# Patient Record
Sex: Male | Born: 1965 | Hispanic: No | Marital: Married | Smoking: Never smoker
Health system: Southern US, Community
[De-identification: ages and names within clinical notes are randomized; demographics above are authoritative.]

## PROBLEM LIST (undated history)

## (undated) DIAGNOSIS — Z9289 Personal history of other medical treatment: Secondary | ICD-10-CM

## (undated) DIAGNOSIS — I2510 Atherosclerotic heart disease of native coronary artery without angina pectoris: Secondary | ICD-10-CM

## (undated) DIAGNOSIS — I219 Acute myocardial infarction, unspecified: Secondary | ICD-10-CM

## (undated) DIAGNOSIS — I10 Essential (primary) hypertension: Secondary | ICD-10-CM

## (undated) DIAGNOSIS — E785 Hyperlipidemia, unspecified: Secondary | ICD-10-CM

## (undated) HISTORY — PX: BACK SURGERY: SHX140

## (undated) HISTORY — DX: Atherosclerotic heart disease of native coronary artery without angina pectoris: I25.10

## (undated) HISTORY — DX: Personal history of other medical treatment: Z92.89

## (undated) HISTORY — PX: CORONARY ANGIOPLASTY: SHX604

---

## 2002-02-11 ENCOUNTER — Encounter: Payer: Self-pay | Admitting: Family Medicine

## 2002-02-11 ENCOUNTER — Ambulatory Visit (HOSPITAL_COMMUNITY): Admission: RE | Admit: 2002-02-11 | Discharge: 2002-02-11 | Payer: Self-pay | Admitting: Family Medicine

## 2003-08-09 ENCOUNTER — Observation Stay (HOSPITAL_COMMUNITY): Admission: AD | Admit: 2003-08-09 | Discharge: 2003-08-10 | Payer: Self-pay | Admitting: Family Medicine

## 2010-12-26 DIAGNOSIS — Z9289 Personal history of other medical treatment: Secondary | ICD-10-CM

## 2010-12-26 HISTORY — DX: Personal history of other medical treatment: Z92.89

## 2010-12-30 ENCOUNTER — Other Ambulatory Visit (HOSPITAL_COMMUNITY): Payer: Self-pay | Admitting: Cardiovascular Disease

## 2011-01-03 ENCOUNTER — Ambulatory Visit (HOSPITAL_COMMUNITY)
Admission: RE | Admit: 2011-01-03 | Discharge: 2011-01-03 | Disposition: A | Discharge status: Home / Self Care | Payer: BC Managed Care – PPO | Source: Ambulatory Visit | Attending: Cardiovascular Disease | Admitting: Cardiovascular Disease

## 2011-01-03 ENCOUNTER — Encounter (HOSPITAL_COMMUNITY): Payer: Self-pay

## 2011-01-03 ENCOUNTER — Encounter (HOSPITAL_COMMUNITY): Payer: Self-pay | Admitting: Cardiovascular Disease

## 2011-01-03 ENCOUNTER — Encounter (HOSPITAL_COMMUNITY): Payer: BC Managed Care – PPO

## 2011-01-03 ENCOUNTER — Encounter (HOSPITAL_COMMUNITY)
Admission: RE | Admit: 2011-01-03 | Discharge: 2011-01-03 | Disposition: A | Discharge status: Home / Self Care | Payer: BC Managed Care – PPO | Source: Ambulatory Visit | Attending: Cardiovascular Disease | Admitting: Cardiovascular Disease

## 2011-01-03 DIAGNOSIS — R079 Chest pain, unspecified: Secondary | ICD-10-CM | POA: Insufficient documentation

## 2011-01-03 DIAGNOSIS — R0609 Other forms of dyspnea: Secondary | ICD-10-CM | POA: Insufficient documentation

## 2011-01-03 DIAGNOSIS — R0989 Other specified symptoms and signs involving the circulatory and respiratory systems: Secondary | ICD-10-CM | POA: Insufficient documentation

## 2011-01-03 DIAGNOSIS — I10 Essential (primary) hypertension: Secondary | ICD-10-CM | POA: Insufficient documentation

## 2011-01-03 HISTORY — DX: Acute myocardial infarction, unspecified: I21.9

## 2011-01-03 HISTORY — DX: Essential (primary) hypertension: I10

## 2011-01-03 HISTORY — DX: Hyperlipidemia, unspecified: E78.5

## 2011-01-03 MED ORDER — TECHNETIUM TC 99M TETROFOSMIN IV KIT
10.0000 | PACK | Freq: Once | INTRAVENOUS | Status: AC | PRN
  Administered 2011-01-03: 9.5 via INTRAVENOUS

## 2011-01-03 MED ORDER — TECHNETIUM TC 99M TETROFOSMIN IV KIT
30.0000 | PACK | Freq: Once | INTRAVENOUS | Status: AC | PRN
  Administered 2011-01-03: 30 via INTRAVENOUS

## 2011-01-04 ENCOUNTER — Encounter (HOSPITAL_COMMUNITY): Payer: Self-pay

## 2011-01-04 NOTE — Procedures (Signed)
NAME:  HASSAAN, CRITE NO.:  [id number]  MEDICAL RECORD NO.:  [id number]  LOCATION:  RAD                           FACILITY:  [facility]  PHYSICIAN:  Thurmon Fair, MD     DATE OF BIRTH:  13-Jul-1965  DATE OF PROCEDURE:  01/03/2011 DATE OF DISCHARGE:                                 STRESS TEST   PROCEDURE PERFORMED:  Lexiscan Myoview study.  The patient is a 45 year old man with a history of previous anterior wall myocardial infarction.  The resting EKG shows sinus bradycardia with a rate of approximately 50 beats per minute and QS pattern from V1 through V6 consistent with an extensive previous myocardial infarction in the distribution of the LAD artery.  No repolarization abnormalities are seen.  Lexiscan 0.4 mg was administered intravenously followed by injection of the radioactive Myoview isotope.  Following Lexiscan administration, the heart rate increased to a maximum about 88 beats per minute.  The blood pressure remained essentially unchanged at 128/78 mmHg.  There are no significant repolarization abnormalities.  The patient did not have any complaints following administration of medications.  CONCLUSION:  Indeterminate electrocardiographic stress test due to pharmacological protocol.  Myoview images to follow.     Thurmon Fair, MD     MC/MEDQ  D:  01/03/2011  T:  01/04/2011  Job:  [id number]  cc:   [facility]

## 2011-01-04 NOTE — Progress Notes (Signed)
  Stress Lab Nurses Notes - William Hill  William Hill 01/03/2011  Reason for doing test: Chest Pain  Type of test: Eugenie Birks Myoview  Nurse performing test: Parke Poisson, RN  Nuclear Medicine Tech: Lou Cal  Echo Tech: Not Applicable  MD performing test: William Hill  Family MD: Phillips Odor  Test explained and consent signed: yes  IV started: 20g jelco, Saline lock flushed, No redness or edema and Saline lock started in radiology  Symptoms: "feeling funny"  Treatment/Intervention: None  Reason test stopped: protocol completed  After recovery IV was: Discontinued via X-ray tech and No redness or edema  Patient to return to Nuc. Med at 13:55 PM.   Patient discharged: Home  Patient's Condition upon discharge was: stable  Comments: symptoms resolved in recovery  [name]

## 2011-05-03 ENCOUNTER — Other Ambulatory Visit (HOSPITAL_COMMUNITY): Payer: BC Managed Care – PPO

## 2011-05-10 ENCOUNTER — Ambulatory Visit (HOSPITAL_COMMUNITY)
Admission: RE | Admit: 2011-05-10 | Discharge: 2011-05-10 | Disposition: A | Discharge status: Home / Self Care | Payer: BC Managed Care – PPO | Source: Ambulatory Visit | Attending: Cardiovascular Disease | Admitting: Cardiovascular Disease

## 2011-05-10 VITALS — BP 126/74 | HR 56

## 2011-05-10 DIAGNOSIS — I10 Essential (primary) hypertension: Secondary | ICD-10-CM | POA: Insufficient documentation

## 2011-05-10 DIAGNOSIS — I252 Old myocardial infarction: Secondary | ICD-10-CM | POA: Insufficient documentation

## 2011-05-10 DIAGNOSIS — I5189 Other ill-defined heart diseases: Secondary | ICD-10-CM | POA: Insufficient documentation

## 2011-05-10 DIAGNOSIS — I8290 Acute embolism and thrombosis of unspecified vein: Secondary | ICD-10-CM

## 2011-05-10 NOTE — Progress Notes (Signed)
piv placed right wrist with 0.9 normal saline at kvo. 1cc of definity image enhancement given per protocol. Pt v/s within normal limits. Anthony Sar RN

## 2011-05-11 MED FILL — Perflutren Lipid Microsphere IV Susp 6.52 MG/ML: INTRAVENOUS | Qty: 2 | Status: AC

## 2012-02-13 DIAGNOSIS — Z9289 Personal history of other medical treatment: Secondary | ICD-10-CM

## 2012-02-13 HISTORY — DX: Personal history of other medical treatment: Z92.89

## 2012-11-14 ENCOUNTER — Other Ambulatory Visit: Payer: Self-pay | Admitting: Cardiovascular Disease

## 2012-11-14 NOTE — Telephone Encounter (Signed)
Rx was sent to pharmacy electronically. 

## 2012-12-10 ENCOUNTER — Ambulatory Visit: Payer: BC Managed Care – PPO | Admitting: Cardiovascular Disease

## 2012-12-16 ENCOUNTER — Other Ambulatory Visit: Payer: Self-pay | Admitting: Cardiovascular Disease

## 2012-12-18 NOTE — Telephone Encounter (Signed)
Rx was sent to pharmacy electronically. 

## 2013-01-14 ENCOUNTER — Ambulatory Visit: Payer: BC Managed Care – PPO | Admitting: Cardiovascular Disease

## 2013-02-07 ENCOUNTER — Encounter: Payer: Self-pay | Admitting: *Deleted

## 2013-02-08 ENCOUNTER — Encounter: Payer: Self-pay | Admitting: Cardiovascular Disease

## 2013-02-11 ENCOUNTER — Ambulatory Visit (INDEPENDENT_AMBULATORY_CARE_PROVIDER_SITE_OTHER): Payer: BC Managed Care – PPO | Admitting: Cardiovascular Disease

## 2013-02-11 ENCOUNTER — Ambulatory Visit: Payer: BC Managed Care – PPO | Admitting: Cardiovascular Disease

## 2013-02-11 ENCOUNTER — Encounter: Payer: Self-pay | Admitting: Cardiovascular Disease

## 2013-02-11 VITALS — BP 138/78 | HR 64 | Ht 69.5 in | Wt 224.3 lb

## 2013-02-11 DIAGNOSIS — E785 Hyperlipidemia, unspecified: Secondary | ICD-10-CM | POA: Insufficient documentation

## 2013-02-11 DIAGNOSIS — Z79899 Other long term (current) drug therapy: Secondary | ICD-10-CM

## 2013-02-11 DIAGNOSIS — E782 Mixed hyperlipidemia: Secondary | ICD-10-CM

## 2013-02-11 DIAGNOSIS — I10 Essential (primary) hypertension: Secondary | ICD-10-CM

## 2013-02-11 DIAGNOSIS — I2589 Other forms of chronic ischemic heart disease: Secondary | ICD-10-CM

## 2013-02-11 DIAGNOSIS — I255 Ischemic cardiomyopathy: Secondary | ICD-10-CM | POA: Insufficient documentation

## 2013-02-11 NOTE — Assessment & Plan Note (Signed)
Well-controlled on current medications 

## 2013-02-11 NOTE — Assessment & Plan Note (Signed)
Status post anterior wall myocardial infarction back in 1999 with PCI and stenting at [facility]. His peak ejection fraction is in the 30-35% in the short axis and  40% otherwise. He denies chest pain or shortness of breath. We will get a repeat 2-D echo for LV function because of DOT requirements

## 2013-02-11 NOTE — Patient Instructions (Addendum)
  We will see you back in follow up in 1 year  Dr Allyson Sabal has ordered an echocardiogram and blood work (to be done fasting)

## 2013-02-11 NOTE — Assessment & Plan Note (Signed)
On statin therapy. Will recheck a fasting lipid and liver profile

## 2013-02-11 NOTE — Progress Notes (Signed)
02/11/2013 William Hill   1966/04/10  [id number]  Primary Physician Provider Not In System Primary Cardiologist: Runell Gess MD [name]   HPI:  The patient is a 47 year old mildly overweight, divorced [place], father of 3 stepchildren, who I saw 6 months ago. He has a history of CAD status post anterior wall myocardial infarction back in 1999 with PCI and stenting at [facility]. His other problems include discontinued tobacco abuse, hypertension, and hyperlipidemia. He works as a Naval architect. We have been following 2D echocardiograms because of "DOT requirements." He denies chest pain or shortness of breath. His most recent lipid profile was over a year ago with an LDL of 58  Since I saw him a year ago he denies chest pain or shortness of breath..      Current Outpatient Prescriptions  Medication Sig Dispense Refill  . aspirin 81 MG tablet Take 81 mg by mouth daily.      Marland Kitchen atorvastatin (LIPITOR) 40 MG tablet TAKE 1 TABLET BY MOUTH AT BEDTIME  30 tablet  3  . benazepril (LOTENSIN) 20 MG tablet TAKE 1 TABLET BY MOUTH EVERY DAY  30 tablet  3  . Coconut Oil 1000 MG CAPS Take 2 capsules by mouth 2 (two) times daily.      Marland Kitchen COREG CR 80 MG 24 hr capsule TAKE ONE CAPSULE BY MOUTH EVERY DAY  30 capsule  1  . fish oil-omega-3 fatty acids 1000 MG capsule Take 1 g by mouth 2 (two) times daily.      . Multiple Vitamin (MULTIVITAMIN WITH MINERALS) TABS tablet Take 1 tablet by mouth daily.      . niacin 500 MG tablet Take 500 mg by mouth daily with breakfast.      . Red Yeast Rice 600 MG CAPS Take 1 capsule by mouth 2 (two) times daily.       No current facility-administered medications for this visit.    No Known Allergies  History   Social History  . Marital Status: Married    Spouse Name: N/A    Number of Children: N/A  . Years of Education: N/A   Occupational History  . Not on file.   Social History Main Topics  . Smoking status: Never  Smoker   . Smokeless tobacco: Never Used  . Alcohol Use: No  . Drug Use: Not on file  . Sexual Activity: Not on file   Other Topics Concern  . Not on file   Social History Narrative  . No narrative on file     Review of Systems: General: negative for chills, fever, night sweats or weight changes.  Cardiovascular: negative for chest pain, dyspnea on exertion, edema, orthopnea, palpitations, paroxysmal nocturnal dyspnea or shortness of breath Dermatological: negative for rash Respiratory: negative for cough or wheezing Urologic: negative for hematuria Abdominal: negative for nausea, vomiting, diarrhea, bright red blood per rectum, melena, or hematemesis Neurologic: negative for visual changes, syncope, or dizziness All other systems reviewed and are otherwise negative except as noted above.    Blood pressure 138/78, pulse 64, height 5' 9.5" (1.765 m), weight 224 lb 4.8 oz (101.742 kg).  General appearance: alert and no distress Neck: no adenopathy, no carotid bruit, no JVD, supple, symmetrical, trachea midline and thyroid not enlarged, symmetric, no tenderness/mass/nodules Lungs: clear to auscultation bilaterally Heart: regular rate and rhythm, S1, S2 normal, no murmur, click, rub or gallop Extremities: extremities normal, atraumatic, no cyanosis or edema  EKG normal sinus  rhythm at 64 with QS pattern in leads V1 through V6 suggesting a large old anterolateral myocardial infarction unchanged from prior EKGs  ASSESSMENT AND PLAN:   Cardiomyopathy, ischemic Status post anterior wall myocardial infarction back in 1999 with PCI and stenting at [facility]. His peak ejection fraction is in the 30-35% in the short axis and  40% otherwise. He denies chest pain or shortness of breath. We will get a repeat 2-D echo for LV function because of DOT requirements  Essential hypertension Well-controlled on current medications  Hyperlipidemia On statin therapy. Will recheck a fasting  lipid and liver profile      Runell Gess MD [facility], [facility] 02/11/2013 10:04 AM

## 2013-02-15 ENCOUNTER — Other Ambulatory Visit: Payer: Self-pay | Admitting: Cardiovascular Disease

## 2013-02-15 NOTE — Telephone Encounter (Signed)
Rx was sent to pharmacy electronically. 

## 2013-02-19 ENCOUNTER — Telehealth: Payer: Self-pay | Admitting: *Deleted

## 2013-02-19 NOTE — Telephone Encounter (Signed)
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 They also requested copy of stress test and echo.  Per Dr Allyson Sabal, he cannot give his opinion about driving until the echo is done (scheduled for 03/04/13).  Test results were sent from [facility] and previous echo.

## 2013-03-04 ENCOUNTER — Ambulatory Visit (HOSPITAL_COMMUNITY)
Admission: RE | Admit: 2013-03-04 | Discharge: 2013-03-04 | Disposition: A | Discharge status: Home / Self Care | Payer: BC Managed Care – PPO | Source: Ambulatory Visit | Attending: Cardiology | Admitting: Cardiology

## 2013-03-04 DIAGNOSIS — I2510 Atherosclerotic heart disease of native coronary artery without angina pectoris: Secondary | ICD-10-CM | POA: Insufficient documentation

## 2013-03-04 DIAGNOSIS — I255 Ischemic cardiomyopathy: Secondary | ICD-10-CM

## 2013-03-04 DIAGNOSIS — I428 Other cardiomyopathies: Secondary | ICD-10-CM | POA: Insufficient documentation

## 2013-03-04 DIAGNOSIS — I10 Essential (primary) hypertension: Secondary | ICD-10-CM | POA: Insufficient documentation

## 2013-03-04 DIAGNOSIS — E785 Hyperlipidemia, unspecified: Secondary | ICD-10-CM | POA: Insufficient documentation

## 2013-03-04 NOTE — Progress Notes (Signed)
2D Echo Performed 03/04/2013    Clearence Ped, RCS

## 2013-03-05 ENCOUNTER — Encounter: Payer: Self-pay | Admitting: *Deleted

## 2013-03-11 ENCOUNTER — Telehealth: Payer: Self-pay | Admitting: *Deleted

## 2013-03-11 DIAGNOSIS — I2510 Atherosclerotic heart disease of native coronary artery without angina pectoris: Secondary | ICD-10-CM

## 2013-03-11 NOTE — Telephone Encounter (Signed)
Verbal order received from Dr Allyson Sabal to proceed with exercise myoview for DOT requirements

## 2013-03-11 NOTE — Telephone Encounter (Signed)
Message copied by Marella Bile on Mon Mar 11, 2013  9:09 AM ------      Message from: Cleon Gustin D      Created: Mon Mar 04, 2013  2:14 PM       This patient checked out from having his echo and stated that hes waiting Dr. Allyson Sabal to give the ok for him to have his stress test that's required for work every 2 years.      Please let me know when the order is in so that I can schedule him.            thanks ------

## 2013-03-12 ENCOUNTER — Telehealth (HOSPITAL_COMMUNITY): Payer: Self-pay | Admitting: Cardiovascular Disease

## 2013-03-12 NOTE — Telephone Encounter (Signed)
Spoke with wife.  Results of echo given to her.  Letter was mailed with results on 03/05/13

## 2013-03-12 NOTE — Telephone Encounter (Signed)
Pt would like to know the results of his echo.

## 2013-03-13 ENCOUNTER — Encounter (HOSPITAL_COMMUNITY): Payer: Self-pay | Admitting: *Deleted

## 2013-03-29 ENCOUNTER — Ambulatory Visit (HOSPITAL_COMMUNITY)
Admission: RE | Admit: 2013-03-29 | Discharge: 2013-03-29 | Disposition: A | Discharge status: Home / Self Care | Payer: BC Managed Care – PPO | Source: Ambulatory Visit | Attending: Cardiovascular Disease | Admitting: Cardiovascular Disease

## 2013-03-29 DIAGNOSIS — I2510 Atherosclerotic heart disease of native coronary artery without angina pectoris: Secondary | ICD-10-CM | POA: Insufficient documentation

## 2013-03-29 MED ORDER — TECHNETIUM TC 99M SESTAMIBI GENERIC - CARDIOLITE
30.8000 | Freq: Once | INTRAVENOUS | Status: AC | PRN
  Administered 2013-03-29: 30.8 via INTRAVENOUS

## 2013-03-29 MED ORDER — REGADENOSON 0.4 MG/5ML IV SOLN
0.4000 mg | Freq: Once | INTRAVENOUS | Status: AC
  Administered 2013-03-29: 0.4 mg via INTRAVENOUS

## 2013-03-29 MED ORDER — TECHNETIUM TC 99M SESTAMIBI GENERIC - CARDIOLITE
10.1000 | Freq: Once | INTRAVENOUS | Status: AC | PRN
  Administered 2013-03-29: 10.1 via INTRAVENOUS

## 2013-03-29 NOTE — Procedures (Addendum)
[facility] [address] [place] [address] [place] [address] [phone]  Cardiology Nuclear Med Study  William [name] a 47 y.o. male     MRN : [id number]     DOB: [birth year]  Procedure Date: 03/29/2013  Nuclear Med Background Indication for Stress Test:  Stent Patency and PTCA Patency History:  MI 1999, Stent, PTCA 1999 Cardiac Risk Factors: Family History - CAD, History of Smoking, Hypertension, Lipids and Overweight  Symptoms:  None   Nuclear Pre-Procedure Caffeine/Decaff Intake:  1:00am NPO After: 11:00am   IV Site: R Antecubital  IV 0.9% NS with Angio Cath:  22g  Chest Size (in):  48 IV Started by: Koren Shiver, CNMT  Height: 5\' 10"  (1.778 m)  Cup Size: n/a  BMI:  Body mass index is 32.14 kg/(m^2). Weight:  224 lb (101.606 kg)   Tech Comments:  Pt took coreg today, test switched to [employer] Med Study 1 or 2 day study: 1 day  Stress Test Type:  Lexiscan  Order Authorizing Provider:  Nanetta Batty, MD   Resting Radionuclide: Technetium 54m Sestamibi  Resting Radionuclide Dose: 10.1 mCi   Stress Radionuclide:  Technetium 71m Sestamibi  Stress Radionuclide Dose: 30.8 mCi           Stress Protocol Rest HR: 51 Stress HR: 82  Rest BP: 136/77 Stress BP: 151/81  Exercise Time (min): n/a METS: n/a          Dose of Adenosine (mg):  n/a Dose of Lexiscan: 0.4 mg  Dose of Atropine (mg): n/a Dose of Dobutamine: n/a mcg/kg/min (at max HR)  Stress Test Technologist: Ernestene Mention, CCT Nuclear Technologist: Gonzella Lex, CNMT   Rest Procedure:  Myocardial perfusion imaging was performed at rest 45 minutes following the intravenous administration of Technetium 80m Sestamibi. Stress Procedure:  The patient received IV Lexiscan 0.4 mg over 15-seconds.  Technetium 8m Sestamibi injected at 30-seconds.  There were no significant changes with Lexiscan.  Quantitative spect images were obtained after a 45 minute  delay.  Transient Ischemic Dilatation (Normal <1.22):  0.96 Lung/Heart Ratio (Normal <0.45):  0.35 QGS EDV:  206 ml QGS ESV:  140 ml LV Ejection Fraction: 32%     Rest ECG: NSR, old anterior MI, old inferior MI  Stress ECG: No significant change from baseline ECG  QPS Raw Data Images:  Normal; no motion artifact; normal heart/lung ratio. Stress Images:  Severe and large perfusion defect in the mid-distal anterior, mid-distal anteroseptal and apical walls. Moderate and large defect in the inferior and inferoseptal walls. Relative sparing of lateral wall perfusion and basal anterior and anteroseptal perfusion. Rest Images:  Comparison with the stress images reveals no significant change. Subtraction (SDS):  There is a fixed defect that is most consistent with a previous infarction. LV Wall Motion:  Moderately depressed overall LV function.Apical dyskinesis, inferior hypokinesis  Impression Exercise Capacity:  Lexiscan with no exercise. BP Response:  Normal blood pressure response. Clinical Symptoms:  No significant symptoms noted. ECG Impression:  No significant ST segment change suggestive of ischemia. Comparison with Prior Nuclear Study: No images to compare. Previous report describes an apical scar   Overall Impression:  Low risk stress nuclear study with large scar in the distribution of the mid-distal LAD artery and nontransmural scar in the distribution of the right coronary artery.Thurmon Fair, MD  03/29/2013 5:55 PM

## 2013-04-03 ENCOUNTER — Encounter: Payer: Self-pay | Admitting: *Deleted

## 2013-04-04 ENCOUNTER — Encounter: Payer: Self-pay | Admitting: *Deleted

## 2013-04-04 NOTE — Progress Notes (Signed)
Quick Note:  Note sent to patient ______ 

## 2013-04-22 LAB — LIPID PANEL
Cholesterol: 145 mg/dL (ref 0–200)
HDL: 37 mg/dL — ABNORMAL LOW (ref >39)
Total CHOL/HDL Ratio: 3.9 Ratio
VLDL: 33 mg/dL (ref 0–40)

## 2013-04-22 LAB — HEPATIC FUNCTION PANEL
AST: 32 U/L (ref 0–37)
Albumin: 4.3 g/dL (ref 3.5–5.2)
Alkaline Phosphatase: 48 U/L (ref 39–117)
Bilirubin, Direct: 0.1 mg/dL (ref 0.0–0.3)
Total Bilirubin: 0.7 mg/dL (ref 0.3–1.2)

## 2013-04-30 ENCOUNTER — Encounter: Payer: Self-pay | Admitting: *Deleted

## 2013-05-17 ENCOUNTER — Telehealth: Payer: Self-pay | Admitting: *Deleted

## 2013-05-17 NOTE — Telephone Encounter (Signed)
Patient dropped off form from occupation health regarding his ability to work based on his echo and myoview results.  Dr Allyson Sabal reviewed the results and wrote on the form that he was cleared to work without limitations.  Form and records faxed to occupation health and patient's wife aware.

## 2014-02-03 ENCOUNTER — Encounter: Payer: Self-pay | Admitting: Cardiovascular Disease

## 2014-02-03 ENCOUNTER — Ambulatory Visit (INDEPENDENT_AMBULATORY_CARE_PROVIDER_SITE_OTHER): Payer: BC Managed Care – PPO | Admitting: Cardiovascular Disease

## 2014-02-03 VITALS — BP 122/88 | HR 58 | Ht 69.5 in | Wt 238.8 lb

## 2014-02-03 DIAGNOSIS — Z79899 Other long term (current) drug therapy: Secondary | ICD-10-CM

## 2014-02-03 DIAGNOSIS — I255 Ischemic cardiomyopathy: Secondary | ICD-10-CM

## 2014-02-03 DIAGNOSIS — E782 Mixed hyperlipidemia: Secondary | ICD-10-CM

## 2014-02-03 DIAGNOSIS — I10 Essential (primary) hypertension: Secondary | ICD-10-CM

## 2014-02-03 DIAGNOSIS — I2589 Other forms of chronic ischemic heart disease: Secondary | ICD-10-CM

## 2014-02-03 NOTE — Patient Instructions (Addendum)
Your physician wants you to follow-up in: 1 year with Dr Allyson SabalBerry. You will receive a reminder letter in the mail two months in advance. If you don't receive a letter, please call our office to schedule the follow-up appointment.    Echocardiogram. Echocardiography is a painless test that uses sound waves to create images of your heart. It provides your doctor with information about the size and shape of your heart and how well your heart's chambers and valves are working. This procedure takes approximately one hour. There are no restrictions for this procedure.   Your physician recommends that you return for a FASTING lipid profile

## 2014-02-03 NOTE — Assessment & Plan Note (Signed)
On statin therapy. We will recheck a lipid and liver profile 

## 2014-02-03 NOTE — Assessment & Plan Note (Signed)
History of CAD status post anterior wall myocardial infarction back in 1999 treated with PCI and stenting at [facility]. He does have ischemic cardiomyopathy. He had a Myoview stress test performed 04/04/13 which revealed scar in the LAD territory without ischemia and a 2-D echo which revealed an E. EF of 35-40%, unchanged from prior readings. He denies chest pain or shortness of breath.

## 2014-02-03 NOTE — Assessment & Plan Note (Signed)
Well-controlled on current medications 

## 2014-02-03 NOTE — Progress Notes (Signed)
02/03/2014 William Hill   06/09/1966  [id number]  Primary Physician PROVIDER NOT IN SYSTEM Primary Cardiologist: Runell GessJonathan J. Viki Carrera MD Roseanne RenoFACP,FACC,FAHA, FSCAI   HPI:  The patient is a 48 year old mildly overweight, divorced [place], father of 3 stepchildren, who I saw 6 months ago. He has a history of CAD status post anterior wall myocardial infarction back in 1999 with PCI and stenting at [facility]. His other problems include discontinued tobacco abuse, hypertension, and hyperlipidemia. He works as a Naval architecttruck driver. We have been following 2D echocardiograms because of "DOT requirements." Since I saw him a year ago he denies chest pain or shortness of breath.. His last lipid profile performed 04/22/13 revealed a total cholesterol of 145, LDL 75 HDL of 37. A Myoview stress test performed in our office 04/04/13 revealed scar in the LAD territory without ischemia.    Current Outpatient Prescriptions  Medication Sig Dispense Refill  . aspirin 81 MG tablet Take 81 mg by mouth daily.      Marland Kitchen. atorvastatin (LIPITOR) 40 MG tablet TAKE 1 TABLET BY MOUTH AT BEDTIME  30 tablet  11  . benazepril (LOTENSIN) 20 MG tablet TAKE 1 TABLET BY MOUTH EVERY DAY  30 tablet  11  . Coconut Oil 1000 MG CAPS Take 2 capsules by mouth 2 (two) times daily.      Marland Kitchen. COREG CR 80 MG 24 hr capsule TAKE ONE CAPSULE BY MOUTH EVERY DAY  30 capsule  11  . fish oil-omega-3 fatty acids 1000 MG capsule Take 1 g by mouth 2 (two) times daily.      . Multiple Vitamin (MULTIVITAMIN WITH MINERALS) TABS tablet Take 1 tablet by mouth daily.      . niacin 500 MG tablet Take 500 mg by mouth daily with breakfast.      . Red Yeast Rice 600 MG CAPS Take 1 capsule by mouth 2 (two) times daily.       No current facility-administered medications for this visit.    No Known Allergies  History   Social History  . Marital Status: Married    Spouse Name: N/A    Number of Children: N/A  . Years of Education: N/A    Occupational History  . Not on file.   Social History Main Topics  . Smoking status: Never Smoker   . Smokeless tobacco: Never Used  . Alcohol Use: No  . Drug Use: Not on file  . Sexual Activity: Not on file   Other Topics Concern  . Not on file   Social History Narrative  . No narrative on file     Review of Systems: General: negative for chills, fever, night sweats or weight changes.  Cardiovascular: negative for chest pain, dyspnea on exertion, edema, orthopnea, palpitations, paroxysmal nocturnal dyspnea or shortness of breath Dermatological: negative for rash Respiratory: negative for cough or wheezing Urologic: negative for hematuria Abdominal: negative for nausea, vomiting, diarrhea, bright red blood per rectum, melena, or hematemesis Neurologic: negative for visual changes, syncope, or dizziness All other systems reviewed and are otherwise negative except as noted above.    Blood pressure 122/88, pulse 58, height 5' 9.5" (1.765 m), weight 238 lb 12.8 oz (108.319 kg).  General appearance: alert and no distress Neck: no adenopathy, no carotid bruit, no JVD, supple, symmetrical, trachea midline and thyroid not enlarged, symmetric, no tenderness/mass/nodules Lungs: clear to auscultation bilaterally Heart: regular rate and rhythm, S1, S2 normal, no murmur, click, rub or gallop Extremities: extremities  normal, atraumatic, no cyanosis or edema  EKG sinus bradycardia at 58 with a nonspecific IVCD and Q waves across the precordium consistent with an old anteroseptal/anterolateral myocardial infarction along with left anterior fascicular block.  ASSESSMENT AND PLAN:   Essential hypertension Well-controlled on current medications  Hyperlipidemia On statin therapy. We will recheck a lipid and liver profile  Cardiomyopathy, ischemic History of CAD status post anterior wall myocardial infarction back in 1999 treated with PCI and stenting at [facility]. He does have  ischemic cardiomyopathy. He had a Myoview stress test performed 04/04/13 which revealed scar in the LAD territory without ischemia and a 2-D echo which revealed an E. EF of 35-40%, unchanged from prior readings. He denies chest pain or shortness of breath.      Runell Gess MD FACP,FACC,FAHA, [facility] 02/03/2014 8:11 AM

## 2014-02-05 ENCOUNTER — Telehealth: Payer: Self-pay | Admitting: Cardiovascular Disease

## 2014-02-05 NOTE — Telephone Encounter (Signed)
Unable to leave msg for patient - voicemail.  Needs to schedule echo per jb.

## 2014-02-10 ENCOUNTER — Telehealth (HOSPITAL_COMMUNITY): Payer: Self-pay | Admitting: *Deleted

## 2014-02-13 ENCOUNTER — Other Ambulatory Visit: Payer: Self-pay | Admitting: Cardiovascular Disease

## 2014-02-13 NOTE — Telephone Encounter (Signed)
Rx was sent to pharmacy electronically. 

## 2014-02-17 ENCOUNTER — Telehealth (HOSPITAL_COMMUNITY): Payer: Self-pay | Admitting: *Deleted

## 2014-03-17 LAB — LDL CHOLESTEROL, DIRECT: LDL DIRECT: 50 mg/dL

## 2014-03-17 LAB — LIPID PANEL
CHOL/HDL RATIO: 4.9 ratio
Cholesterol: 148 mg/dL (ref 0–200)
HDL: 30 mg/dL — AB (ref >39)
Triglycerides: 527 mg/dL — ABNORMAL HIGH (ref <150)

## 2014-03-17 LAB — HEPATIC FUNCTION PANEL
ALK PHOS: 55 U/L (ref 39–117)
ALT: 60 U/L — ABNORMAL HIGH (ref 0–53)
AST: 33 U/L (ref 0–37)
Albumin: 4.4 g/dL (ref 3.5–5.2)
BILIRUBIN DIRECT: 0.1 mg/dL (ref 0.0–0.3)
BILIRUBIN INDIRECT: 0.6 mg/dL (ref 0.2–1.2)
BILIRUBIN TOTAL: 0.7 mg/dL (ref 0.2–1.2)
TOTAL PROTEIN: 6.8 g/dL (ref 6.0–8.3)

## 2014-03-18 ENCOUNTER — Telehealth: Payer: Self-pay | Admitting: Cardiovascular Disease

## 2014-03-18 DIAGNOSIS — E785 Hyperlipidemia, unspecified: Secondary | ICD-10-CM

## 2014-03-18 NOTE — Telephone Encounter (Signed)
Pt called and wanted you to know after thinking about it,for lunch he had spinach with cheese,dinner he had eaten a whole thin crust small size pizza. This might have contributed to his readings for cholesterol levels.He said He had talked to Dennard Schaumann she gave him his results.

## 2014-03-20 NOTE — Telephone Encounter (Signed)
Checking open encounter,not sure this original message was received.

## 2014-03-20 NOTE — Telephone Encounter (Signed)
Called patient, no answer 

## 2014-03-20 NOTE — Telephone Encounter (Signed)
FORWARD TO KATHRYN VOGEL RN 

## 2014-03-21 ENCOUNTER — Other Ambulatory Visit: Payer: Self-pay | Admitting: Cardiovascular Disease

## 2014-03-21 ENCOUNTER — Encounter: Payer: Self-pay | Admitting: *Deleted

## 2014-03-21 NOTE — Telephone Encounter (Signed)
E-SENT RX 

## 2014-03-21 NOTE — Telephone Encounter (Signed)
No answer, letter mailed.

## 2015-01-05 ENCOUNTER — Telehealth: Payer: Self-pay | Admitting: Cardiovascular Disease

## 2015-01-05 NOTE — Telephone Encounter (Signed)
Closed enocunter °

## 2015-01-21 ENCOUNTER — Ambulatory Visit (INDEPENDENT_AMBULATORY_CARE_PROVIDER_SITE_OTHER): Payer: BLUE CROSS/BLUE SHIELD | Admitting: Cardiovascular Disease

## 2015-01-21 ENCOUNTER — Encounter: Payer: Self-pay | Admitting: Cardiovascular Disease

## 2015-01-21 VITALS — BP 118/72 | HR 53 | Ht 69.5 in | Wt 220.3 lb

## 2015-01-21 DIAGNOSIS — E785 Hyperlipidemia, unspecified: Secondary | ICD-10-CM

## 2015-01-21 DIAGNOSIS — I10 Essential (primary) hypertension: Secondary | ICD-10-CM | POA: Diagnosis not present

## 2015-01-21 DIAGNOSIS — Z79899 Other long term (current) drug therapy: Secondary | ICD-10-CM | POA: Diagnosis not present

## 2015-01-21 DIAGNOSIS — I2510 Atherosclerotic heart disease of native coronary artery without angina pectoris: Secondary | ICD-10-CM | POA: Diagnosis not present

## 2015-01-21 NOTE — Patient Instructions (Signed)
Medication Instructions:   n/a  Labwork:  A FASTING lipid profile: to be done at your convenience.  There is a Diplomatic Services operational officer lab on the first floor of this building, [address].  They are open from 8am-5pm with a lunch from 12-2.  You do not need an appointment.    Testing/Procedures:   Echocardiogram. Echocardiography is a painless test that uses sound waves to create images of your heart. It provides your doctor with information about the size and shape of your heart and how well your heart's chambers and valves are working. This procedure takes approximately one hour. There are no restrictions for this procedure.   Exercise tolerance test. For further information please visit [email or web address] Please also follow instruction sheet, as given.    Follow-Up:  1 year with Dr Allyson Sabal  Any Other Special Instructions Will Be Listed Below (If Applicable).

## 2015-01-21 NOTE — Assessment & Plan Note (Signed)
History of hyperlipidemia on atorvastatin and red yeast rice. We will recheck a lipid and liver profile

## 2015-01-21 NOTE — Progress Notes (Signed)
01/21/2015 William Hill   1965-12-30  [id number]  Primary Physician [facility] Primary Cardiologist: Runell Gess MD [name]   HPI:  The patient is a 49 year old mildly overweight, divorced [place], father of 3 stepchildren, who I saw 12 months ago. He has a history of CAD status post anterior wall myocardial infarction back in 1999 with PCI and stenting at [facility]. His other problems include discontinued tobacco abuse, hypertension, and hyperlipidemia. 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 His last lipid profile performed 04/22/13 revealed a total cholesterol of 145, LDL 75 HDL of 37. A Myoview stress test performed in our office 04/04/13 revealed scar in the LAD territory without ischemia. Since I saw him in the office one year ago he's remained currently stable denying chest pain or shortness of breath.   Current Outpatient Prescriptions  Medication Sig Dispense Refill  . aspirin 81 MG tablet Take 81 mg by mouth daily.    Marland Kitchen atorvastatin (LIPITOR) 40 MG tablet TAKE 1 TABLET BY MOUTH AT BEDTIME 30 tablet 11  . benazepril (LOTENSIN) 20 MG tablet TAKE 1 TABLET BY MOUTH EVERY DAY 30 tablet 11  . Coconut Oil 1000 MG CAPS Take 2 capsules by mouth 2 (two) times daily.    Marland Kitchen COREG CR 80 MG 24 hr capsule TAKE ONE CAPSULE BY MOUTH EVERY DAY 30 capsule 11  . fish oil-omega-3 fatty acids 1000 MG capsule Take 1 g by mouth 2 (two) times daily.    . Multiple Vitamin (MULTIVITAMIN WITH MINERALS) TABS tablet Take 1 tablet by mouth daily.    . niacin 500 MG tablet Take 500 mg by mouth daily with breakfast.    . Red Yeast Rice 600 MG CAPS Take 1 capsule by mouth 2 (two) times daily.     No current facility-administered medications for this visit.    No Known Allergies  History   Social History  .  Marital Status: Married    Spouse Name: N/A  . Number of Children: N/A  . Years of Education: N/A   Occupational History  . Not on file.   Social History Main Topics  . Smoking status: Never Smoker   . Smokeless tobacco: Never Used  . Alcohol Use: No  . Drug Use: Not on file  . Sexual Activity: Not on file   Other Topics Concern  . Not on file   Social History Narrative     Review of Systems: General: negative for chills, fever, night sweats or weight changes.  Cardiovascular: negative for chest pain, dyspnea on exertion, edema, orthopnea, palpitations, paroxysmal nocturnal dyspnea or shortness of breath Dermatological: negative for rash Respiratory: negative for cough or wheezing Urologic: negative for hematuria Abdominal: negative for nausea, vomiting, diarrhea, bright red blood per rectum, melena, or hematemesis Neurologic: negative for visual changes, syncope, or dizziness All other systems reviewed and are otherwise negative except as noted above.    Blood pressure 118/72, pulse 53, height 5' 9.5" (1.765 m), weight 220 lb 4.8 oz (99.927 kg).  General appearance: alert and no distress Neck: no adenopathy, no carotid bruit, no JVD, supple, symmetrical, trachea midline and thyroid not enlarged, symmetric, no tenderness/mass/nodules Lungs: clear to auscultation bilaterally Heart: regular rate and rhythm, S1, S2 normal, no murmur, click, rub or gallop Extremities: extremities normal, atraumatic, no cyanosis or  edema  EKG sinus bradycardia at 53 with left axis deviation and anteroseptal Q waves. A Persantine reviewed this EKG  ASSESSMENT AND PLAN:   Hyperlipidemia History of hyperlipidemia on atorvastatin and red yeast rice. We will recheck a lipid and liver profile  Essential hypertension History of hypertension with blood pressure measurements at 118/72. He is on Coreg and Lotensin. Continue current meds at current dosing  Cardiomyopathy, ischemic History of  ischemic cardiomyopathy status post anterior wall myocardial infarction back in 1999 with PCI and stenting at [facility]. 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 He had a stress test performed on office 04/04/13 that revealed scar in the LAD territory without ischemia. I'm going to get a 2-D echo T and GXT.      Runell Gess MD FACP,FACC,FAHA, [facility] 01/21/2015 10:49 AM

## 2015-01-21 NOTE — Assessment & Plan Note (Signed)
History of hypertension with blood pressure measurements at 118/72. He is on Coreg and Lotensin. Continue current meds at current dosing

## 2015-01-21 NOTE — Assessment & Plan Note (Signed)
History of ischemic cardiomyopathy status post anterior wall myocardial infarction back in 1999 with PCI and stenting at [facility]. 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 He had a stress test performed on office 04/04/13 that revealed scar in the LAD territory without ischemia. I'm going to get a 2-D echo T and GXT.

## 2015-02-21 ENCOUNTER — Other Ambulatory Visit: Payer: Self-pay | Admitting: Cardiovascular Disease

## 2015-02-23 NOTE — Telephone Encounter (Signed)
°  1. Which medications need to be refilled? Coreg  2. Which pharmacy is medication to be sent to? [facility] in [place]  3. Do they need a 30 day or 90 day supply? 30  4. Would they like a call back once the medication has been sent to the pharmacy? Yes

## 2015-02-23 NOTE — Telephone Encounter (Signed)
REFILL 

## 2015-03-16 ENCOUNTER — Ambulatory Visit (INDEPENDENT_AMBULATORY_CARE_PROVIDER_SITE_OTHER): Payer: BLUE CROSS/BLUE SHIELD

## 2015-03-16 ENCOUNTER — Encounter: Payer: BLUE CROSS/BLUE SHIELD | Admitting: Physician Assistant

## 2015-03-16 ENCOUNTER — Ambulatory Visit (HOSPITAL_COMMUNITY): Payer: BLUE CROSS/BLUE SHIELD

## 2015-03-16 DIAGNOSIS — I2510 Atherosclerotic heart disease of native coronary artery without angina pectoris: Secondary | ICD-10-CM

## 2015-03-16 LAB — EXERCISE TOLERANCE TEST
CSEPEW: 13.1 METS
CSEPHR: 76 %
CSEPPHR: 131 {beats}/min
Exercise duration (min): 10 min
Exercise duration (sec): 52 s
MPHR: 172 {beats}/min
RPE: 13
Rest HR: 56 {beats}/min

## 2015-03-21 ENCOUNTER — Other Ambulatory Visit: Payer: Self-pay | Admitting: Cardiovascular Disease

## 2015-03-23 ENCOUNTER — Ambulatory Visit (HOSPITAL_COMMUNITY): Payer: BLUE CROSS/BLUE SHIELD | Attending: Cardiovascular Disease

## 2015-03-23 ENCOUNTER — Other Ambulatory Visit: Payer: Self-pay

## 2015-03-23 ENCOUNTER — Other Ambulatory Visit (HOSPITAL_COMMUNITY): Payer: BLUE CROSS/BLUE SHIELD | Admitting: *Deleted

## 2015-03-23 DIAGNOSIS — E785 Hyperlipidemia, unspecified: Secondary | ICD-10-CM | POA: Insufficient documentation

## 2015-03-23 DIAGNOSIS — I429 Cardiomyopathy, unspecified: Secondary | ICD-10-CM | POA: Diagnosis not present

## 2015-03-23 DIAGNOSIS — I10 Essential (primary) hypertension: Secondary | ICD-10-CM | POA: Insufficient documentation

## 2015-03-23 DIAGNOSIS — I2510 Atherosclerotic heart disease of native coronary artery without angina pectoris: Secondary | ICD-10-CM | POA: Diagnosis not present

## 2015-03-23 MED ORDER — PERFLUTREN LIPID MICROSPHERE
1.0000 mL | INTRAVENOUS | Status: AC | PRN
  Administered 2015-03-23: 1.5 mL via INTRAVENOUS

## 2015-04-06 ENCOUNTER — Telehealth: Payer: Self-pay | Admitting: Cardiovascular Disease

## 2015-04-06 NOTE — Telephone Encounter (Signed)
Pt called in stating that he received a message about calling back to the office to get the result to his Echo that was performed on 9/26. Please f/u with him.   Thanks

## 2015-04-06 NOTE — Telephone Encounter (Signed)
Returned call to patient echo results given.Advised Dr.Berry wants him to schedule appointment to discuss results.Patient stated he can only come on a Monday.Advised Dr.Berry has no Monday appointments available the rest of this year.Stated can he just call me with results.Stated he prefers to be called on a Monday.Stated he does not have voice mail set up on home and cell phones.Advised Dr.Berry's nurse Selena Batten will be in office 04/14/15,advised to call her then to set up best time for him to be reached.Message sent to Dr.Berry's nurse Selena Batten.

## 2015-04-14 NOTE — Telephone Encounter (Signed)
Pt is calling back in to get the results to his Echo.

## 2015-04-16 NOTE — Telephone Encounter (Signed)
Pt has appt 11/2 with Wilburt FinlayBryan Hager, PA

## 2015-04-20 ENCOUNTER — Other Ambulatory Visit: Payer: Self-pay | Admitting: Cardiovascular Disease

## 2015-04-29 ENCOUNTER — Ambulatory Visit (INDEPENDENT_AMBULATORY_CARE_PROVIDER_SITE_OTHER): Payer: BLUE CROSS/BLUE SHIELD | Admitting: Physician Assistant

## 2015-04-29 ENCOUNTER — Encounter: Payer: Self-pay | Admitting: Physician Assistant

## 2015-04-29 VITALS — BP 136/78 | HR 72 | Ht 69.5 in | Wt 226.4 lb

## 2015-04-29 DIAGNOSIS — I255 Ischemic cardiomyopathy: Secondary | ICD-10-CM | POA: Diagnosis not present

## 2015-04-29 NOTE — Patient Instructions (Addendum)
Your physician recommends that you schedule a follow-up appointment in: July 2017 with Dr. Allyson SabalBerry.

## 2015-04-29 NOTE — Progress Notes (Signed)
Patient ID: William PaisRichard C Hill, male   DOB: 11/13/1965, 49 y.o.   MRN: [id number]    Date:  04/29/2015   ID:  William Hill, DOB 05/08/1966, MRN [id number]  [facility]  Primary Cardiologist:  Allyson SabalBerry   Chief Complaint  Patient presents with  . Follow-up    No complaints of chest pain, SOB,edema or dizziness.     History of Present Illness: William PaisRichard C [name] a 49 y.o. obese, divorced Native [place] male, father of 3 stepchildren. He has a history of CAD status post anterior wall myocardial infarction back in 1999 with PCI and stenting at [facility]. His other problems include discontinued tobacco abuse, hypertension, and hyperlipidemia. He works as a Naval architecttruck driver. We have been following 2D echocardiograms because of "DOT requirements."  His last lipid profile performed 04/22/13 revealed a total cholesterol of 145, LDL 75 HDL of 37. A Myoview stress test performed in our office 04/04/13 revealed scar in the LAD territory without ischemia.  New echocardiogram 03/23/2015 which revealed an ejection fraction of 25-30% with moderate diffuse hypokinesis. There is dyskinesis of the apical myocardium akinesis of the mid apical anteroanseptal, anterior and anterior lateral myocardium consistent with infarction in distribution of the left anterior descending artery. There was no evidence of thrombus.  He also had a exercise tolerance test which was negative for ischemic changes to the EKG.   he exercised for 13.1 METS.  The patient currently denies nausea, vomiting, fever, chest pain, shortness of breath, orthopnea, dizziness, PND, cough, congestion, abdominal pain, hematochezia, melena, lower extremity edema, claudication.  Wt Readings from Last 3 Encounters:  04/29/15 226 lb 6.4 oz (102.694 kg)  01/21/15 220 lb 4.8 oz (99.927 kg)  02/03/14 238 lb 12.8 oz (108.319 kg)     Past Medical History  Diagnosis Date  . Hypertension   . Heart attack (HCC)   . Hyperlipidemia   .  CAD (coronary artery disease)     post anterior wall myocardial infarction in 1999 with PCI and stenting at [facility].  . Hx of echocardiogram 02/13/2012    EF 35%. There is apical dyskinesis  . Hx of echocardiogram 05/09/2012    difinity contrast that showed no apical mural thrombus with an EF of 30%-35%  . History of stress test 12/2010    EF of 36% with apical scar    Current Outpatient Prescriptions  Medication Sig Dispense Refill  . aspirin 81 MG tablet Take 81 mg by mouth daily.    Marland Kitchen. atorvastatin (LIPITOR) 40 MG tablet TAKE 1 TABLET BY MOUTH AT BEDTIME 30 tablet 6  . benazepril (LOTENSIN) 20 MG tablet TAKE 1 TABLET BY MOUTH EVERY DAY 30 tablet 6  . Coconut Oil 1000 MG CAPS Take 2 capsules by mouth 2 (two) times daily.    Marland Kitchen. COREG CR 80 MG 24 hr capsule TAKE ONE CAPSULE BY MOUTH EVERY DAY 30 capsule 11  . fish oil-omega-3 fatty acids 1000 MG capsule Take 1 g by mouth 2 (two) times daily.    . Multiple Vitamin (MULTIVITAMIN WITH MINERALS) TABS tablet Take 1 tablet by mouth daily.    . niacin 500 MG tablet Take 500 mg by mouth daily with breakfast.    . Red Yeast Rice 600 MG CAPS Take 1 capsule by mouth 2 (two) times daily.     No current facility-administered medications for this visit.    Allergies:   No Known Allergies  Social History:  The patient  reports that he has never smoked. He has never used smokeless tobacco. He reports that he does not drink alcohol.   Family history:   Family History  Problem Relation Age of Onset  . Cancer Father   . Heart Problems Maternal Grandmother   . Stroke Maternal Grandfather   . Heart attack Paternal Grandfather     ROS:  Please see the history of present illness.  All other systems reviewed and negative.   PHYSICAL EXAM: VS:  BP 136/78 mmHg  Pulse 72  Ht 5' 9.5" (1.765 m)  Wt 226 lb 6.4 oz (102.694 kg)  BMI 32.97 kg/m2 Well nourished, well developed, in no acute distress HEENT: Pupils are equal round react to light  accommodation extraocular movements are intact.  Neck: no JVDNo cervical lymphadenopathy. Cardiac: Regular rate and rhythm without murmurs rubs or gallops. Lungs:  clear to auscultation bilaterally, no wheezing, rhonchi or rales Abd: soft, nontender, positive bowel sounds all quadrants, no hepatosplenomegaly Ext: no lower extremity edema.  2+ radial and dorsalis pedis pulses. Skin: warm and dry Neuro:  Grossly normal    ASSESSMENT AND PLAN:  Problem List Items Addressed This Visit    None     Ischemic cardio myopathy:  Ejection fractions last echo was 25-30%. Previously was 35-40 however, shows essentially the same area was affected previously. He has no signs of acute heart failure was able to perform his normal activities at home including mowing the yard which he takes about 3 hours.  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  Essential hypertension: Blood pressure mildly elevated. No changes in current medications.  Obesity: We talked about low carb diet which patient says he is already been trying to adhere to. Also provide him with contact information to registered dietitian if he should like to go see her.  Hyperlipidemia: Continue Lipitor

## 2015-11-21 ENCOUNTER — Other Ambulatory Visit: Payer: Self-pay | Admitting: Cardiovascular Disease

## 2015-11-24 NOTE — Telephone Encounter (Signed)
Rx(s) sent to pharmacy electronically.  

## 2016-01-19 ENCOUNTER — Encounter: Payer: Self-pay | Admitting: Cardiovascular Disease

## 2016-01-19 ENCOUNTER — Ambulatory Visit (INDEPENDENT_AMBULATORY_CARE_PROVIDER_SITE_OTHER): Payer: BLUE CROSS/BLUE SHIELD | Admitting: Cardiovascular Disease

## 2016-01-19 VITALS — BP 130/76 | HR 71 | Ht 69.5 in | Wt 241.8 lb

## 2016-01-19 DIAGNOSIS — I519 Heart disease, unspecified: Secondary | ICD-10-CM | POA: Diagnosis not present

## 2016-01-19 DIAGNOSIS — Z79899 Other long term (current) drug therapy: Secondary | ICD-10-CM

## 2016-01-19 DIAGNOSIS — I4891 Unspecified atrial fibrillation: Secondary | ICD-10-CM | POA: Insufficient documentation

## 2016-01-19 DIAGNOSIS — E785 Hyperlipidemia, unspecified: Secondary | ICD-10-CM | POA: Diagnosis not present

## 2016-01-19 DIAGNOSIS — I4819 Other persistent atrial fibrillation: Secondary | ICD-10-CM

## 2016-01-19 DIAGNOSIS — I481 Persistent atrial fibrillation: Secondary | ICD-10-CM | POA: Diagnosis not present

## 2016-01-19 LAB — BASIC METABOLIC PANEL
BUN: 9 mg/dL (ref 7–25)
CALCIUM: 9.6 mg/dL (ref 8.6–10.3)
CO2: 26 mmol/L (ref 20–31)
Chloride: 103 mmol/L (ref 98–110)
Creat: 0.95 mg/dL (ref 0.60–1.35)
GLUCOSE: 104 mg/dL — AB (ref 65–99)
Potassium: 4.7 mmol/L (ref 3.5–5.3)
Sodium: 139 mmol/L (ref 135–146)

## 2016-01-19 NOTE — Progress Notes (Signed)
01/19/2016 [name]   [birth year]  [id number]  Primary Physician [facility] Primary Cardiologist: Runell Gess MD Nicholes Calamity, [place]  HPI:  The patient is a 50 year old mildly overweight, divorced [place], father of 3 stepchildren, who I saw 01/21/15 . He has a history of CAD status post anterior wall myocardial infarction back in 1999 with PCI and stenting at [facility]. His other problems include discontinued tobacco abuse, hypertension, and hyperlipidemia. 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 His last lipid profile performed 04/22/13 revealed a total cholesterol of 145, LDL 75 HDL of 37. A Myoview stress test performed in our office 04/04/13 revealed scar in the LAD territory without ischemia. Since I saw him in the office one year ago he's remained currently stable denying chest pain or shortness of breath. He is however a newly recognized atrial fibrillation with a controlled ventricular response and is asymptomatic from this.  Current Outpatient Prescriptions  Medication Sig Dispense Refill  . aspirin 81 MG tablet Take 81 mg by mouth daily.    Marland Kitchen atorvastatin (LIPITOR) 40 MG tablet TAKE 1 TABLET BY MOUTH AT BEDTIME 30 tablet 2  . benazepril (LOTENSIN) 20 MG tablet TAKE 1 TABLET BY MOUTH EVERY DAY 30 tablet 2  . Coconut Oil 1000 MG CAPS Take 2 capsules by mouth 2 (two) times daily.    Marland Kitchen COREG CR 80 MG 24 hr capsule TAKE ONE CAPSULE BY MOUTH EVERY DAY 30 capsule 11  . fish oil-omega-3 fatty acids 1000 MG capsule Take 1 g by mouth 2 (two) times daily.    . Multiple Vitamin (MULTIVITAMIN WITH MINERALS) TABS tablet Take 1 tablet by mouth daily.    . niacin 500 MG tablet Take 500 mg by mouth daily with breakfast.     No current facility-administered medications for this visit.     No Known  Allergies  Social History   Social History  . Marital status: Married    Spouse name: N/A  . Number of children: N/A  . Years of education: N/A   Occupational History  . Not on file.   Social History Main Topics  . Smoking status: Never Smoker  . Smokeless tobacco: Never Used  . Alcohol use No  . Drug use: Unknown  . Sexual activity: Not on file   Other Topics Concern  . Not on file   Social History Narrative  . No narrative on file     Review of Systems: General: negative for chills, fever, night sweats or weight changes.  Cardiovascular: negative for chest pain, dyspnea on exertion, edema, orthopnea, palpitations, paroxysmal nocturnal dyspnea or shortness of breath Dermatological: negative for rash Respiratory: negative for cough or wheezing Urologic: negative for hematuria Abdominal: negative for nausea, vomiting, diarrhea, bright red blood per rectum, melena, or hematemesis Neurologic: negative for visual changes, syncope, or dizziness All other systems reviewed and are otherwise negative except as noted above.    Blood pressure 130/76, pulse 71, height 5' 9.5" (1.765 m), weight 241 lb 12.8 oz (109.7 kg).  General appearance: alert and no distress Neck: no adenopathy, no carotid bruit, no JVD, supple, symmetrical, trachea midline and thyroid not enlarged, symmetric, no tenderness/mass/nodules Lungs: clear to auscultation bilaterally Heart: irregularly irregular rhythm Extremities: extremities normal, atraumatic, no cyanosis or edema  EKG atrial fibrillation with a  ventricular response of 71, left anterior fascicular block and large anterolateral myocardial infarction Q waves in V1 through V6. I personally reviewed this EKG  ASSESSMENT AND PLAN:   Essential hypertension History of hypertension blood pressure measured at 130/76. He is on enalapril and Coreg. Continue current meds at current dosing  Hyperlipidemia History of hyperlipidemia on Lipitor. We will  recheck a lipid liver profile  Cardiomyopathy, ischemic History of ischemic cardiomyopathy status post anterior wall myocardial infarction in 1999 treated with PCI and stenting at [facility]. He has remained clinically stable. His EF is in the 25-30% range. His last Myoview performed 2 years ago showed scar in the LAD territory. We will recheck a 2-D echocardiogram.  Atrial fibrillation [facility]) Mr. Manansala is in A. fib today with controlled ventricular response of 71. He is unaware that he is in a knee rhythm. This has not been a problem with this in the past. The CHA2DSVASC2 score is 2  . I'm going to initiate a NOAC for stroke prophylaxis.      Runell Gess MD FACP,FACC,FAHA, [facility] 01/19/2016 10:11 AM

## 2016-01-19 NOTE — Assessment & Plan Note (Signed)
History of hypertension blood pressure measured at 130/76. He is on enalapril and Coreg. Continue current meds at current dosing

## 2016-01-19 NOTE — Patient Instructions (Signed)
Medication Instructions:  Your physician recommends that you continue on your current medications as directed. Please refer to the Current Medication list given to you today.   Labwork: Your physician recommends that you return for lab work AT YOUR EARLIEST CONVENIENCE- YOU WILL NEED TO BE FASTING. The lab can be found on the FIRST FLOOR of out building in [address]   Testing/Procedures: Your physician has requested that you have an echocardiogram. Echocardiography is a painless test that uses sound waves to create images of your heart. It provides your doctor with information about the size and shape of your heart and how well your heart's chambers and valves are working. This procedure takes approximately one hour. There are no restrictions for this procedure.    Follow-Up: We request that you follow-up in: 6 MONTHS with an extender and in 12 MONTHS with Dr San Morelle will receive a reminder letter in the mail two months in advance. If you don't receive a letter, please call our office to schedule the follow-up appointment.   Echocardiogram An echocardiogram, or echocardiography, uses sound waves (ultrasound) to produce an image of your heart. The echocardiogram is simple, painless, obtained within a short period of time, and offers valuable information to your health care provider. The images from an echocardiogram can provide information such as:  Evidence of coronary artery disease (CAD).  Heart size.  Heart muscle function.  Heart valve function.  Aneurysm detection.  Evidence of a past heart attack.  Fluid buildup around the heart.  Heart muscle thickening.  Assess heart valve function. LET [facility] CARE PROVIDER KNOW ABOUT:  Any allergies you have.  All medicines you are taking, including vitamins, herbs, eye drops, creams, and over-the-counter medicines.  Previous problems you or members of your family have had with the use of anesthetics.  Any blood  disorders you have.  Previous surgeries you have had.  Medical conditions you have.  Possibility of pregnancy, if this applies. BEFORE THE PROCEDURE  No special preparation is needed. Eat and drink normally.  PROCEDURE   In order to produce an image of your heart, gel will be applied to your chest and a wand-like tool (transducer) will be moved over your chest. The gel will help transmit the sound waves from the transducer. The sound waves will harmlessly bounce off your heart to allow the heart images to be captured in real-time motion. These images will then be recorded.  You may need an IV to receive a medicine that improves the quality of the pictures. AFTER THE PROCEDURE You may return to your normal schedule including diet, activities, and medicines, unless your health care provider tells you otherwise.   This information is not intended to replace advice given to you by your health care provider. Make sure you discuss any questions you have with your health care provider.   Document Released: 06/10/2000 Document Revised: 07/04/2014 Document Reviewed: 02/18/2013 Elsevier Interactive Patient Education [employer]  If you need a refill on your cardiac medications before your next appointment, please call your pharmacy.

## 2016-01-19 NOTE — Assessment & Plan Note (Signed)
History of hyperlipidemia on Lipitor. We will recheck a lipid liver profile

## 2016-01-19 NOTE — Assessment & Plan Note (Signed)
History of ischemic cardiomyopathy status post anterior wall myocardial infarction in 1999 treated with PCI and stenting at [facility]. He has remained clinically stable. His EF is in the 25-30% range. His last Myoview performed 2 years ago showed scar in the LAD territory. We will recheck a 2-D echocardiogram.

## 2016-01-19 NOTE — Assessment & Plan Note (Signed)
William [name] in A. fib today with controlled ventricular response of 71. He is unaware that he is in a knee rhythm. This has not been a problem with this in the past. The CHA2DSVASC2 score is 2  . I'm going to initiate a NOAC for stroke prophylaxis.

## 2016-01-20 ENCOUNTER — Telehealth: Payer: Self-pay | Admitting: Pharmacist

## 2016-01-20 MED ORDER — RIVAROXABAN 20 MG PO TABS: 20.0000 mg | ORAL_TABLET | Freq: Every day | ORAL | 1 refills | Status: DC

## 2016-01-20 MED ORDER — RIVAROXABAN 20 MG PO TABS: 20.0000 mg | ORAL_TABLET | Freq: Every day | ORAL | 0 refills | Status: DC

## 2016-01-20 NOTE — Telephone Encounter (Signed)
Labs good.  SCr 0.95  CrCl with IBW 96.  Ok to start on Xarelto 20 mg daily with meal.  Wife voiced understanding of directions

## 2016-02-01 ENCOUNTER — Ambulatory Visit (HOSPITAL_COMMUNITY): Payer: BLUE CROSS/BLUE SHIELD | Attending: Cardiovascular Disease

## 2016-02-01 ENCOUNTER — Encounter (INDEPENDENT_AMBULATORY_CARE_PROVIDER_SITE_OTHER): Payer: Self-pay

## 2016-02-01 ENCOUNTER — Other Ambulatory Visit: Payer: Self-pay

## 2016-02-01 DIAGNOSIS — I2510 Atherosclerotic heart disease of native coronary artery without angina pectoris: Secondary | ICD-10-CM | POA: Diagnosis not present

## 2016-02-01 DIAGNOSIS — E785 Hyperlipidemia, unspecified: Secondary | ICD-10-CM | POA: Diagnosis not present

## 2016-02-01 DIAGNOSIS — I119 Hypertensive heart disease without heart failure: Secondary | ICD-10-CM | POA: Diagnosis not present

## 2016-02-01 DIAGNOSIS — R29898 Other symptoms and signs involving the musculoskeletal system: Secondary | ICD-10-CM | POA: Diagnosis not present

## 2016-02-01 DIAGNOSIS — I519 Heart disease, unspecified: Secondary | ICD-10-CM | POA: Diagnosis not present

## 2016-02-01 DIAGNOSIS — Z79899 Other long term (current) drug therapy: Secondary | ICD-10-CM

## 2016-02-20 ENCOUNTER — Other Ambulatory Visit: Payer: Self-pay | Admitting: Cardiovascular Disease

## 2016-02-22 NOTE — Telephone Encounter (Signed)
Rx(s) sent to pharmacy electronically.  

## 2016-09-01 ENCOUNTER — Other Ambulatory Visit: Payer: Self-pay | Admitting: Cardiovascular Disease

## 2016-09-18 ENCOUNTER — Emergency Department (HOSPITAL_COMMUNITY)
Admission: EM | Admit: 2016-09-18 | Discharge: 2016-09-18 | Disposition: A | Discharge status: Home / Self Care | Payer: BLUE CROSS/BLUE SHIELD | Attending: Emergency Medicine | Admitting: Emergency Medicine

## 2016-09-18 ENCOUNTER — Encounter (HOSPITAL_COMMUNITY): Payer: Self-pay | Admitting: Emergency Medicine

## 2016-09-18 DIAGNOSIS — Z79899 Other long term (current) drug therapy: Secondary | ICD-10-CM | POA: Diagnosis not present

## 2016-09-18 DIAGNOSIS — J40 Bronchitis, not specified as acute or chronic: Secondary | ICD-10-CM | POA: Diagnosis not present

## 2016-09-18 DIAGNOSIS — I10 Essential (primary) hypertension: Secondary | ICD-10-CM | POA: Diagnosis not present

## 2016-09-18 DIAGNOSIS — R05 Cough: Secondary | ICD-10-CM | POA: Diagnosis present

## 2016-09-18 DIAGNOSIS — I2510 Atherosclerotic heart disease of native coronary artery without angina pectoris: Secondary | ICD-10-CM | POA: Diagnosis not present

## 2016-09-18 DIAGNOSIS — J329 Chronic sinusitis, unspecified: Secondary | ICD-10-CM | POA: Diagnosis not present

## 2016-09-18 DIAGNOSIS — J069 Acute upper respiratory infection, unspecified: Secondary | ICD-10-CM | POA: Insufficient documentation

## 2016-09-18 DIAGNOSIS — Z7982 Long term (current) use of aspirin: Secondary | ICD-10-CM | POA: Insufficient documentation

## 2016-09-18 MED ORDER — OXYMETAZOLINE HCL 0.05 % NA SOLN: 2.0000 | Freq: Two times a day (BID) | NASAL | 0 refills | Status: DC

## 2016-09-18 MED ORDER — DEXAMETHASONE 4 MG PO TABS: 4.0000 mg | ORAL_TABLET | Freq: Two times a day (BID) | ORAL | 0 refills | Status: DC

## 2016-09-18 MED ORDER — DOXYCYCLINE HYCLATE 100 MG PO TABS
100.0000 mg | ORAL_TABLET | Freq: Once | ORAL | Status: AC
  Administered 2016-09-18: 100 mg via ORAL
  Filled 2016-09-18: qty 1

## 2016-09-18 MED ORDER — PREDNISONE 20 MG PO TABS
40.0000 mg | ORAL_TABLET | Freq: Once | ORAL | Status: AC
  Administered 2016-09-18: 40 mg via ORAL
  Filled 2016-09-18: qty 2

## 2016-09-18 MED ORDER — HYDROCOD POLST-CPM POLST ER 10-8 MG/5ML PO SUER
5.0000 mL | Freq: Once | ORAL | Status: AC
  Administered 2016-09-18: 5 mL via ORAL
  Filled 2016-09-18: qty 5

## 2016-09-18 MED ORDER — HYDROCODONE-HOMATROPINE 5-1.5 MG/5ML PO SYRP: 5.0000 mL | ORAL_SOLUTION | Freq: Four times a day (QID) | ORAL | 0 refills | Status: DC | PRN

## 2016-09-18 MED ORDER — HYDROCODONE-HOMATROPINE 5-1.5 MG/5ML PO SYRP
5.0000 mL | ORAL_SOLUTION | Freq: Four times a day (QID) | ORAL | 0 refills | Status: DC | PRN
Start: 2016-09-18 — End: 2018-01-12

## 2016-09-18 MED ORDER — DOXYCYCLINE HYCLATE 100 MG PO CAPS: 100.0000 mg | ORAL_CAPSULE | Freq: Two times a day (BID) | ORAL | 0 refills | Status: DC

## 2016-09-18 NOTE — Discharge Instructions (Signed)
Your oxygen level is 98% on room air. Your examination favors sinusitis, bronchitis, and upper respiratory infection. Please use 2 sprays of Afrin in each nostril 2 times daily. Use Decadron and doxycycline 2 times daily with food. Use Hycodan for cough. This medication may cause drowsiness, please do not drive, drink alcohol, operate machinery, or participated in activities requiring concentration when taking this medication. Please schedule a follow-[facility], and take your medications prescribed today with you so that they may added to your medical record.

## 2016-09-18 NOTE — ED Triage Notes (Signed)
Onset yesterday, cough,  Nasal congestion, some sore throat,

## 2016-09-18 NOTE — ED Provider Notes (Signed)
[facility]-EM[facility]Y DEPT Provider Note   CSN: [id number] Arrival date & time: 09/18/16  2056     History   Chief Complaint Chief Complaint  Patient presents with  . Nasal Congestion    HPI William [name] a 51 y.o. male.  Patient is a 51 year old male who presents to the emergency department with a complaint of cough and congestion.  The patient reports a cough, sore throat, and congestion that started on yesterday March 24. There is no reported high fever. There's no hemoptysis reported. Patient able to get liquids down. His been no recent operations or procedures involving the chest or throat area. No unusual rash reported. No vomiting and no diarrhea reported.      Past Medical History:  Diagnosis Date  . CAD (coronary artery disease)    post anterior wall myocardial infarction in 1999 with PCI and stenting at [facility].  . Heart attack   . History of stress test 12/2010   EF of 36% with apical scar  . Hx of echocardiogram 02/13/2012   EF 35%. There is apical dyskinesis  . Hx of echocardiogram 05/09/2012   difinity contrast that showed no apical mural thrombus with an EF of 30%-35%  . Hyperlipidemia   . Hypertension     Patient Active Problem List   Diagnosis Date Noted  . Atrial fibrillation (HCC) 01/19/2016  . Essential hypertension 02/11/2013  . Hyperlipidemia 02/11/2013  . Cardiomyopathy, ischemic 02/11/2013    Past Surgical History:  Procedure Laterality Date  . CORONARY ANGIOPLASTY         Home Medications    Prior to Admission medications   Medication Sig Start Date End Date Taking? Authorizing Provider  aspirin 81 MG tablet Take 81 mg by mouth daily.    Historical Provider, MD  atorvastatin (LIPITOR) 40 MG tablet TAKE 1 TABLET BY MOUTH AT BEDTIME 02/22/16   Runell GessJonathan J Berry, MD  benazepril (LOTENSIN) 20 MG tablet TAKE 1 TABLET BY MOUTH EVERY DAY 02/22/16   Runell GessJonathan J Berry, MD  Coconut Oil 1000 MG CAPS Take 2 capsules by mouth 2 (two) times  daily.    Historical Provider, MD  COREG CR 80 MG 24 hr capsule TAKE ONE CAPSULE BY MOUTH EVERY DAY 02/22/16   Runell GessJonathan J Berry, MD  fish oil-omega-3 fatty acids 1000 MG capsule Take 1 g by mouth 2 (two) times daily.    Historical Provider, MD  Multiple Vitamin (MULTIVITAMIN WITH MINERALS) TABS tablet Take 1 tablet by mouth daily.    Historical Provider, MD  niacin 500 MG tablet Take 500 mg by mouth daily with breakfast.    Historical Provider, MD  XARELTO 20 MG TABS tablet TAKE ONE TABLET BY MOUTH ONCE DAILY WITH SUPPER 09/01/16   Runell GessJonathan J Berry, MD    Family History Family History  Problem Relation Age of Onset  . Cancer Father   . Heart Problems Maternal Grandmother   . Stroke Maternal Grandfather   . Heart attack Paternal Grandfather     Social History Social History  Substance Use Topics  . Smoking status: Never Smoker  . Smokeless tobacco: Never Used  . Alcohol use No     Allergies   Patient has no known allergies.   Review of Systems Review of Systems  Constitutional: Positive for fever.  HENT: Positive for congestion, postnasal drip and sore throat.   Respiratory: Positive for cough.   Gastrointestinal: Negative for diarrhea, nausea and vomiting.  Musculoskeletal: Positive for myalgias.  All other  systems reviewed and are negative.    Physical Exam Updated Vital Signs BP 114/68 (BP Location: Left Arm)   Pulse 80   Temp 98 F (36.7 C) (Temporal)   Resp 20   Ht 5' 9.5" (1.765 m)   Wt 108.9 kg   SpO2 100%   BMI 34.93 kg/m   Physical Exam  Constitutional: He is oriented to person, place, and time. He appears well-developed and well-nourished.  Non-toxic appearance.  HENT:  Head: Normocephalic.  Right Ear: Tympanic membrane and external ear normal.  Left Ear: Tympanic membrane and external ear normal.  Nasal congestion present. Mild to mod increase redness of the posterior pharynx. Airway patent.  Eyes: EOM and lids are normal. Pupils are equal, round,  and reactive to light.  Neck: Normal range of motion. Neck supple. Carotid bruit is not present.  Cardiovascular: Normal rate, regular rhythm, normal heart sounds, intact distal pulses and normal pulses.  Exam reveals no friction rub.   No murmur heard. Pulmonary/Chest: Breath sounds normal. No respiratory distress.  Course breath sounds present. Symmetrical rise and fall of the chest.  Abdominal: Soft. Bowel sounds are normal. There is no tenderness. There is no guarding.  Musculoskeletal: Normal range of motion.  Lymphadenopathy:       Head (right side): No submandibular adenopathy present.       Head (left side): No submandibular adenopathy present.    He has no cervical adenopathy.  Neurological: He is alert and oriented to person, place, and time. He has normal strength. No cranial nerve deficit or sensory deficit.  Skin: Skin is warm and dry. No rash noted.  Psychiatric: He has a normal mood and affect. His speech is normal.  Nursing note and vitals reviewed.    ED Treatments / Results  Labs (all labs ordered are listed, but only abnormal results are displayed) Labs Reviewed - No data to display  EKG  EKG Interpretation None       Radiology No results found.  Procedures Procedures (including critical care time)  Medications Ordered in ED Medications - No data to display   Initial Impression / Assessment and Plan / ED Course  I have reviewed the triage vital signs and the nursing notes.  Pertinent labs & imaging results that were available during my care of the patient were reviewed by me and considered in my medical decision making (see chart for details).       Final Clinical Impressions(s) / ED Diagnoses MDM Vital signs reviewed. Pulse oximetry is 98% on room air. Patient speaks in complete sentences without problem. There is nasal congestion present. There are coarse breath sounds noted. The patient has cough during the examination. I suspect the patient  has an upper respiratory infection with sinusitis and bronchitis. The patient will be treated with doxycycline, Decadron, and Hycodan for cough. Patient advised to use Afrin for 4 days only. Patient is to follow-up with his physicians at [facility] medical week.    Final diagnoses:  None    New Prescriptions New Prescriptions   No medications on file     Ivery Quale, PA-C 09/18/16 2245    Bethann Berkshire, MD 09/18/16 2256

## 2016-12-05 ENCOUNTER — Telehealth: Payer: Self-pay | Admitting: Cardiovascular Disease

## 2016-12-05 DIAGNOSIS — I480 Paroxysmal atrial fibrillation: Secondary | ICD-10-CM

## 2016-12-05 DIAGNOSIS — E7800 Pure hypercholesterolemia, unspecified: Secondary | ICD-10-CM

## 2016-12-05 DIAGNOSIS — I255 Ischemic cardiomyopathy: Secondary | ICD-10-CM

## 2016-12-05 NOTE — Telephone Encounter (Signed)
Left message for patient, echo order has been placed for scheduling. Lab orders mailed to the pt

## 2016-12-05 NOTE — Telephone Encounter (Signed)
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

## 2017-01-16 ENCOUNTER — Other Ambulatory Visit: Payer: Self-pay

## 2017-01-16 ENCOUNTER — Ambulatory Visit (HOSPITAL_COMMUNITY): Payer: BLUE CROSS/BLUE SHIELD | Attending: Cardiovascular Disease

## 2017-01-16 DIAGNOSIS — I10 Essential (primary) hypertension: Secondary | ICD-10-CM | POA: Diagnosis not present

## 2017-01-16 DIAGNOSIS — E785 Hyperlipidemia, unspecified: Secondary | ICD-10-CM | POA: Insufficient documentation

## 2017-01-16 DIAGNOSIS — I2510 Atherosclerotic heart disease of native coronary artery without angina pectoris: Secondary | ICD-10-CM | POA: Diagnosis not present

## 2017-01-16 DIAGNOSIS — I255 Ischemic cardiomyopathy: Secondary | ICD-10-CM | POA: Diagnosis present

## 2017-01-16 DIAGNOSIS — I252 Old myocardial infarction: Secondary | ICD-10-CM | POA: Insufficient documentation

## 2017-01-16 MED ORDER — PERFLUTREN LIPID MICROSPHERE
1.0000 mL | INTRAVENOUS | Status: AC | PRN
  Administered 2017-01-16: 2 mL via INTRAVENOUS

## 2017-01-20 ENCOUNTER — Ambulatory Visit (INDEPENDENT_AMBULATORY_CARE_PROVIDER_SITE_OTHER): Payer: BLUE CROSS/BLUE SHIELD | Admitting: Cardiovascular Disease

## 2017-01-20 ENCOUNTER — Encounter: Payer: Self-pay | Admitting: Cardiovascular Disease

## 2017-01-20 VITALS — BP 120/78 | HR 50 | Ht 69.5 in | Wt 228.0 lb

## 2017-01-20 DIAGNOSIS — E785 Hyperlipidemia, unspecified: Secondary | ICD-10-CM

## 2017-01-20 DIAGNOSIS — I10 Essential (primary) hypertension: Secondary | ICD-10-CM

## 2017-01-20 DIAGNOSIS — I255 Ischemic cardiomyopathy: Secondary | ICD-10-CM | POA: Diagnosis not present

## 2017-01-20 DIAGNOSIS — I480 Paroxysmal atrial fibrillation: Secondary | ICD-10-CM | POA: Diagnosis not present

## 2017-01-20 LAB — HEPATIC FUNCTION PANEL
ALK PHOS: 56 IU/L (ref 39–117)
ALT: 75 IU/L — ABNORMAL HIGH (ref 0–44)
AST: 48 IU/L — ABNORMAL HIGH (ref 0–40)
Albumin: 4.8 g/dL (ref 3.5–5.5)
Bilirubin Total: 0.8 mg/dL (ref 0.0–1.2)
Bilirubin, Direct: 0.19 mg/dL (ref 0.00–0.40)
TOTAL PROTEIN: 7.1 g/dL (ref 6.0–8.5)

## 2017-01-20 LAB — LIPID PANEL
CHOL/HDL RATIO: 3.9 ratio (ref 0.0–5.0)
Cholesterol, Total: 130 mg/dL (ref 100–199)
HDL: 33 mg/dL — ABNORMAL LOW (ref >39)
LDL CALC: 57 mg/dL (ref 0–99)
Triglycerides: 198 mg/dL — ABNORMAL HIGH (ref 0–149)
VLDL CHOLESTEROL CAL: 40 mg/dL (ref 5–40)

## 2017-01-20 NOTE — Patient Instructions (Signed)
Medication Instructions: Your physician recommends that you continue on your current medications as directed. Please refer to the Current Medication list given to you today.  Labwork: Your physician recommends that you return for a FASTING lipid profile and hepatic function panel: today   Testing/Procedures: Your physician has requested that you have an echocardiogram in 1 year. Echocardiography is a painless test that uses sound waves to create images of your heart. It provides your doctor with information about the size and shape of your heart and how well your heart's chambers and valves are working. This procedure takes approximately one hour. There are no restrictions for this procedure.  Follow-Up: Your physician wants you to follow-up in: 1 year with Dr. Gerrie NordmannBerry--after echo. You will receive a reminder letter in the mail two months in advance. If you don't receive a letter, please call our office to schedule the follow-up appointment.  If you need a refill on your cardiac medications before your next appointment, please call your pharmacy.

## 2017-01-20 NOTE — Progress Notes (Signed)
01/20/2017 William Hill   07/06/1965  [id number]  [facility] Primary Cardiologist: Runell GessJonathan J Emeril Stille MD Nicholes CalamityFACP, FACC, FAHA, [place]  HPI:  The patient is a 51 year old mildly overweight, divorced [place], father of 3 stepchildren, who I saw 01/19/16. He has a history of CAD status post anterior wall myocardial infarction back in 1999 with PCI and stenting at [facility]. His other problems include discontinued tobacco abuse, hypertension, and hyperlipidemia. He works as a Naval architecttruck driver. We have been following 2D echocardiograms because of "DOT requirements." Since I saw him a year ago he denies chest pain or shortness of breath.. His last lipid profile performed 04/22/13 revealed a total cholesterol of 145, LDL 75 HDL of 37. A Myoview stress test performed in our office 04/04/13 revealed scar in the LAD territory without ischemia. Since I saw him in the office one year ago he's remained currently stable denying chest pain or shortness of breath. When I saw him one year ago he was in atrial fibrillation with a controlled ventricular response but was asymptomatic. I did begin him on Xarelto. He currently is in sinus rhythm. A 2-D echo performed 01/16/17 revealed an EF of 30-35%.   Current Meds  Medication Sig  . aspirin 81 MG tablet Take 81 mg by mouth daily.  Marland Kitchen. atorvastatin (LIPITOR) 40 MG tablet TAKE 1 TABLET BY MOUTH AT BEDTIME  . benazepril (LOTENSIN) 20 MG tablet TAKE 1 TABLET BY MOUTH EVERY DAY  . COREG CR 80 MG 24 hr capsule TAKE ONE CAPSULE BY MOUTH EVERY DAY  . dexamethasone (DECADRON) 4 MG tablet Take 1 tablet (4 mg total) by mouth 2 (two) times daily with a meal.  . dexamethasone (DECADRON) 4 MG tablet Take 1 tablet (4 mg total) by mouth 2 (two) times daily with a meal.  . doxycycline (VIBRAMYCIN) 100 MG capsule Take 1 capsule (100 mg total) by mouth 2 (two) times daily.  Marland Kitchen. doxycycline (VIBRAMYCIN) 100 MG capsule Take 1 capsule  (100 mg total) by mouth 2 (two) times daily.  . fish oil-omega-3 fatty acids 1000 MG capsule Take 1 g by mouth 2 (two) times daily.  Marland Kitchen. HYDROcodone-homatropine (HYCODAN) 5-1.5 MG/5ML syrup Take 5 mLs by mouth every 6 (six) hours as needed.  Marland Kitchen. HYDROcodone-homatropine (HYCODAN) 5-1.5 MG/5ML syrup Take 5 mLs by mouth every 6 (six) hours as needed.  . Multiple Vitamin (MULTIVITAMIN WITH MINERALS) TABS tablet Take 1 tablet by mouth daily.  . niacin 500 MG tablet Take 500 mg by mouth daily with breakfast.  . oxymetazoline (AFRIN NASAL SPRAY) 0.05 % nasal spray Place 2 sprays into both nostrils 2 (two) times daily.  Marland Kitchen. oxymetazoline (AFRIN NASAL SPRAY) 0.05 % nasal spray Place 2 sprays into both nostrils 2 (two) times daily.  Carlena Hurl. XARELTO 20 MG TABS tablet TAKE ONE TABLET BY MOUTH ONCE DAILY WITH SUPPER  . [DISCONTINUED] Coconut Oil 1000 MG CAPS Take 2 capsules by mouth 2 (two) times daily.     No Known Allergies  Social History   Social History  . Marital status: Married    Spouse name: N/A  . Number of children: N/A  . Years of education: N/A   Occupational History  . Not on file.   Social History Main Topics  . Smoking status: Never Smoker  . Smokeless tobacco: Never Used  . Alcohol use No  . Drug use: Unknown  . Sexual activity: Not on file   Other Topics Concern  .  Not on file   Social History Narrative  . No narrative on file     Review of Systems: General: negative for chills, fever, night sweats or weight changes.  Cardiovascular: negative for chest pain, dyspnea on exertion, edema, orthopnea, palpitations, paroxysmal nocturnal dyspnea or shortness of breath Dermatological: negative for rash Respiratory: negative for cough or wheezing Urologic: negative for hematuria Abdominal: negative for nausea, vomiting, diarrhea, bright red blood per rectum, melena, or hematemesis Neurologic: negative for visual changes, syncope, or dizziness All other systems reviewed and are  otherwise negative except as noted above.    Blood pressure 120/78, pulse (!) 50, height 5' 9.5" (1.765 m), weight 228 lb (103.4 kg).  General appearance: alert and no distress Neck: no adenopathy, no carotid bruit, no JVD, supple, symmetrical, trachea midline and thyroid not enlarged, symmetric, no tenderness/mass/nodules Lungs: clear to auscultation bilaterally Heart: regular rate and rhythm, S1, S2 normal, no murmur, click, rub or gallop Extremities: extremities normal, atraumatic, no cyanosis or edema  EKG sinus bradycardia 50 with inferior Q waves, anteroseptal/anterolateral Q waves with inferior lateral T-wave inversion. I personally reviewed this EKG.  ASSESSMENT AND PLAN:   Essential hypertension History of essential hypertension blood pressure measured 120/70. He is on benazepril, carvedilol. Continue current meds at current dosing  Hyperlipidemia History of hyperlipidemia on statin therapy. We will recheck a lipid and liver profile today  Cardiomyopathy, ischemic History of ischemic cardiomyopathy with a recent 2-D echo performed 01/16/17 revealing an EF of 30-35% which has remained fairly stable. He is on optimal medical therapy for his LV dysfunction and is asymptomatic.  Atrial fibrillation (HCC) History of paroxysmal for ablation currently in sinus rhythm on Xarelto  oral anticoagulation with a CHA2DSVASC2 score of 2.      Runell GessJonathan J. Alila Sotero MD FACP,FACC,FAHA, [facility] 01/20/2017 10:28 AM

## 2017-01-20 NOTE — Assessment & Plan Note (Addendum)
History of paroxysmal for ablation currently in sinus rhythm on Xarelto  oral anticoagulation with a CHA2DSVASC2 score of 2.

## 2017-01-20 NOTE — Assessment & Plan Note (Signed)
History of essential hypertension blood pressure measured 120/70. He is on benazepril, carvedilol. Continue current meds at current dosing

## 2017-01-20 NOTE — Assessment & Plan Note (Signed)
History of hyperlipidemia on statin therapy.  We will recheck a lipid and liver profile today 

## 2017-01-20 NOTE — Assessment & Plan Note (Signed)
History of ischemic cardiomyopathy with a recent 2-D echo performed 01/16/17 revealing an EF of 30-35% which has remained fairly stable. He is on optimal medical therapy for his LV dysfunction and is asymptomatic.

## 2017-01-26 ENCOUNTER — Other Ambulatory Visit: Payer: Self-pay

## 2017-01-26 ENCOUNTER — Telehealth: Payer: Self-pay | Admitting: Surgery

## 2017-01-26 DIAGNOSIS — R748 Abnormal levels of other serum enzymes: Secondary | ICD-10-CM

## 2017-01-26 NOTE — Telephone Encounter (Signed)
Pt's wife calling for lab results.

## 2017-01-26 NOTE — Telephone Encounter (Signed)
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

## 2017-01-26 NOTE — Telephone Encounter (Signed)
Spoke to patient lab and echo results given. 

## 2017-02-04 ENCOUNTER — Other Ambulatory Visit: Payer: Self-pay | Admitting: Cardiovascular Disease

## 2017-03-04 ENCOUNTER — Other Ambulatory Visit: Payer: Self-pay | Admitting: Cardiovascular Disease

## 2017-03-07 ENCOUNTER — Telehealth: Payer: Self-pay | Admitting: Pharmacist Clinician (PhC)/ Clinical Pharmacy Specialist

## 2017-03-07 DIAGNOSIS — I482 Chronic atrial fibrillation, unspecified: Secondary | ICD-10-CM

## 2017-03-07 NOTE — Telephone Encounter (Signed)
Spoke with wife, will mail lab order for BMET for October when he gets hepatic panel re-drawn

## 2017-03-30 ENCOUNTER — Telehealth: Payer: Self-pay | Admitting: Cardiovascular Disease

## 2017-03-30 DIAGNOSIS — I255 Ischemic cardiomyopathy: Secondary | ICD-10-CM

## 2017-03-30 NOTE — Telephone Encounter (Signed)
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

## 2017-03-30 NOTE — Telephone Encounter (Signed)
Spoke with patient, gxt scheduled for 04-17-17 per patient request.

## 2017-03-30 NOTE — Telephone Encounter (Signed)
Unable to reach pt or leave a message mailbox is not set up. 

## 2017-04-17 ENCOUNTER — Other Ambulatory Visit: Payer: Self-pay | Admitting: Cardiovascular Disease

## 2017-04-17 ENCOUNTER — Ambulatory Visit (INDEPENDENT_AMBULATORY_CARE_PROVIDER_SITE_OTHER): Payer: BLUE CROSS/BLUE SHIELD

## 2017-04-17 DIAGNOSIS — I2510 Atherosclerotic heart disease of native coronary artery without angina pectoris: Secondary | ICD-10-CM | POA: Diagnosis not present

## 2017-04-17 DIAGNOSIS — I255 Ischemic cardiomyopathy: Secondary | ICD-10-CM | POA: Diagnosis not present

## 2017-04-18 LAB — EXERCISE TOLERANCE TEST
CHL CUP MPHR: 170 {beats}/min
CSEPED: 9 min
CSEPPHR: 153 {beats}/min
Estimated workload: 10.1 METS
Exercise duration (sec): 0 s
Percent HR: 90 %
RPE: 13
Rest HR: 51 {beats}/min

## 2017-05-08 ENCOUNTER — Other Ambulatory Visit: Payer: Self-pay

## 2017-05-08 DIAGNOSIS — E785 Hyperlipidemia, unspecified: Secondary | ICD-10-CM

## 2017-05-08 LAB — HEPATIC FUNCTION PANEL
ALK PHOS: 57 IU/L (ref 39–117)
ALT: 33 IU/L (ref 0–44)
AST: 24 IU/L (ref 0–40)
Albumin: 4.5 g/dL (ref 3.5–5.5)
Bilirubin Total: 0.6 mg/dL (ref 0.0–1.2)
Bilirubin, Direct: 0.2 mg/dL (ref 0.00–0.40)
TOTAL PROTEIN: 6.7 g/dL (ref 6.0–8.5)

## 2017-05-08 LAB — LIPID PANEL
CHOL/HDL RATIO: 3.2 ratio (ref 0.0–5.0)
Cholesterol, Total: 107 mg/dL (ref 100–199)
HDL: 33 mg/dL — ABNORMAL LOW (ref >39)
LDL CALC: 52 mg/dL (ref 0–99)
TRIGLYCERIDES: 109 mg/dL (ref 0–149)
VLDL Cholesterol Cal: 22 mg/dL (ref 5–40)

## 2017-05-08 LAB — BASIC METABOLIC PANEL
BUN / CREAT RATIO: 10 (ref 9–20)
BUN: 9 mg/dL (ref 6–24)
CALCIUM: 9.6 mg/dL (ref 8.7–10.2)
CO2: 24 mmol/L (ref 20–29)
CREATININE: 0.93 mg/dL (ref 0.76–1.27)
Chloride: 101 mmol/L (ref 96–106)
GFR, EST AFRICAN AMERICAN: 110 mL/min/{1.73_m2} (ref >59)
GFR, EST NON AFRICAN AMERICAN: 95 mL/min/{1.73_m2} (ref >59)
GLUCOSE: 116 mg/dL — AB (ref 65–99)
Potassium: 5.1 mmol/L (ref 3.5–5.2)
SODIUM: 139 mmol/L (ref 134–144)

## 2017-05-08 LAB — CBC
HEMATOCRIT: 51.3 % — AB (ref 37.5–51.0)
Hemoglobin: 18.6 g/dL — ABNORMAL HIGH (ref 13.0–17.7)
MCH: 35.7 pg — AB (ref 26.6–33.0)
MCHC: 36.3 g/dL — AB (ref 31.5–35.7)
MCV: 99 fL — AB (ref 79–97)
PLATELETS: 167 10*3/uL (ref 150–379)
RBC: 5.21 x10E6/uL (ref 4.14–5.80)
RDW: 13.1 % (ref 12.3–15.4)
WBC: 7.7 10*3/uL (ref 3.4–10.8)

## 2017-05-08 LAB — TSH: TSH: 0.896 u[IU]/mL (ref 0.450–4.500)

## 2017-06-25 ENCOUNTER — Other Ambulatory Visit: Payer: Self-pay | Admitting: Cardiovascular Disease

## 2017-09-29 ENCOUNTER — Other Ambulatory Visit: Payer: Self-pay | Admitting: Cardiovascular Disease

## 2017-11-13 ENCOUNTER — Telehealth: Payer: Self-pay | Admitting: *Deleted

## 2017-11-13 NOTE — Telephone Encounter (Signed)
Patient calling about Carvedilol . Insurance will not cover that dose. They will cover up to  dose pill.  Patient would like a call back to know what to do.

## 2017-11-14 NOTE — Telephone Encounter (Signed)
He can take 25 mg PO BID Coreg in place of Coreg CR 80 mg

## 2017-11-15 NOTE — Telephone Encounter (Signed)
Left vm for pt with recommendation--ok per DPR. Asked pt to call back with pharmacy and will send in med at that time.

## 2017-11-27 MED ORDER — CARVEDILOL 25 MG PO TABS: 25.0000 mg | ORAL_TABLET | Freq: Two times a day (BID) | ORAL | 3 refills | Status: DC

## 2017-11-27 NOTE — Telephone Encounter (Signed)
Spoke with pt, aware carvedilol will be sent into the pharmacy.

## 2017-11-27 NOTE — Telephone Encounter (Signed)
Pt calling   Pt calling back concerning previous message and stated his pharmacy is   [facility] [address]   Please call pt if any other questions.

## 2018-01-01 ENCOUNTER — Ambulatory Visit (HOSPITAL_COMMUNITY): Payer: BLUE CROSS/BLUE SHIELD | Attending: Cardiology

## 2018-01-01 ENCOUNTER — Other Ambulatory Visit: Payer: Self-pay

## 2018-01-01 DIAGNOSIS — I4891 Unspecified atrial fibrillation: Secondary | ICD-10-CM | POA: Diagnosis not present

## 2018-01-01 DIAGNOSIS — I119 Hypertensive heart disease without heart failure: Secondary | ICD-10-CM | POA: Insufficient documentation

## 2018-01-01 DIAGNOSIS — I255 Ischemic cardiomyopathy: Secondary | ICD-10-CM | POA: Diagnosis present

## 2018-01-01 DIAGNOSIS — E785 Hyperlipidemia, unspecified: Secondary | ICD-10-CM | POA: Insufficient documentation

## 2018-01-01 MED ORDER — PERFLUTREN LIPID MICROSPHERE
1.0000 mL | INTRAVENOUS | Status: AC | PRN
  Administered 2018-01-01: 2 mL via INTRAVENOUS

## 2018-01-12 ENCOUNTER — Ambulatory Visit: Payer: BLUE CROSS/BLUE SHIELD | Admitting: Physician Assistant

## 2018-01-12 ENCOUNTER — Encounter: Payer: Self-pay | Admitting: Physician Assistant

## 2018-01-12 VITALS — BP 122/82 | HR 63 | Ht 69.0 in | Wt 226.4 lb

## 2018-01-12 DIAGNOSIS — I255 Ischemic cardiomyopathy: Secondary | ICD-10-CM

## 2018-01-12 DIAGNOSIS — E785 Hyperlipidemia, unspecified: Secondary | ICD-10-CM | POA: Diagnosis not present

## 2018-01-12 DIAGNOSIS — I10 Essential (primary) hypertension: Secondary | ICD-10-CM

## 2018-01-12 DIAGNOSIS — I2510 Atherosclerotic heart disease of native coronary artery without angina pectoris: Secondary | ICD-10-CM | POA: Diagnosis not present

## 2018-01-12 DIAGNOSIS — I480 Paroxysmal atrial fibrillation: Secondary | ICD-10-CM

## 2018-01-12 LAB — HEPATIC FUNCTION PANEL
ALT: 68 IU/L — AB (ref 0–44)
AST: 42 IU/L — AB (ref 0–40)
Albumin: 4.6 g/dL (ref 3.5–5.5)
Alkaline Phosphatase: 53 IU/L (ref 39–117)
BILIRUBIN, DIRECT: 0.27 mg/dL (ref 0.00–0.40)
Bilirubin Total: 1.1 mg/dL (ref 0.0–1.2)
Total Protein: 7.2 g/dL (ref 6.0–8.5)

## 2018-01-12 LAB — LIPID PANEL
CHOLESTEROL TOTAL: 132 mg/dL (ref 100–199)
Chol/HDL Ratio: 4.4 ratio (ref 0.0–5.0)
HDL: 30 mg/dL — AB (ref >39)
LDL Calculated: 61 mg/dL (ref 0–99)
Triglycerides: 204 mg/dL — ABNORMAL HIGH (ref 0–149)
VLDL Cholesterol Cal: 41 mg/dL — ABNORMAL HIGH (ref 5–40)

## 2018-01-12 NOTE — Patient Instructions (Signed)
Medication Instructions: Azalee CourseHao Meng, PA recommends that you continue on your current medications as directed. Please refer to the Current Medication list given to you today.  Labwork: Your physician recommends that you return for lab work at your convenience - FASTING.   Testing/Procedures: NONE ORDERED  Follow-up: Wynema BirchHao recommends that you schedule a follow-up appointment in 12 months with Dr Allyson SabalBerry.  If you need a refill on your cardiac medications before your next appointment, please call your pharmacy.

## 2018-01-12 NOTE — Progress Notes (Signed)
Cardiology Office Note    Date:  01/14/2018   ID:  William Hill, DOB 12-13-1965, MRN [id number]  PCP:  [facility]  Cardiologist:  Dr. Allyson Sabal  Chief Complaint  Patient presents with  . Follow-up    echo was completed    History of Present Illness:  William [name] a 52 y.o. male with PMH of CAD, tobacco abuse, PAF, HTN and HLD.  He had a remote anterior wall MI in 1999 with PCI and stenting at [facility].  He works as a Naval architect.  Myoview performed in October 2014 revealed a scar in the LAD territory without ischemia.  He was later diagnosed with atrial fibrillation and started on Xarelto.  Previous echocardiogram obtained in July 2018 showed EF 30 to 35%, grade 2 DD.  His last ETT obtained on 04/17/2017 was low risk for ischemia, however patient did go into atrial fibrillation with RVR 2 minutes into the study.  Echocardiogram obtained on 01/01/2018 showed EF 30 to 35%, akinesis of the entire inferior myocardium, akinesis of the apical anterior myocardium and apical myocardium, no obvious LV thrombus by Definity contrast study however there was swelling of the contrast in the LV apex concerning for possible early forming thrombus.  Patient presents today for cardiology office visit.  He denies any chest pain or shortness of breath.  He has not been getting much exercise lately as he has been working 4 days straight.  I urged him to increase activity level on the weekend.  He will need a fasting lipid panel and LFT.  He has no lower extremity edema, orthopnea or PND.  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  His next stress test will be in 2020.  His recent echocardiogram is stable, he has been compliant with Xarelto.  He is not on aspirin even though it is listed under his medication list.  He has no problem complete at least 4 METs of activity at this time.  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  His EKG shows he is in atrial fibrillation today.  He has no cardiac awareness.  We do not know when he went into atrial fibrillation.  However given lack of any symptoms, we will continue him on rate control strategy for now.  I will defer to Dr. Allyson Sabal to decide if he wished to obtain a Lexiscan Myoview along with echocardiogram next year.   Past Medical History:  Diagnosis Date  . CAD (coronary artery disease)    post anterior wall myocardial infarction in 1999 with PCI and stenting at [facility].  . Heart attack (HCC)   . History of stress test 12/2010   EF of 36% with apical scar  . Hx of echocardiogram 02/13/2012   EF 35%. There is apical dyskinesis  . Hx of echocardiogram 05/09/2012   difinity contrast that showed no apical mural thrombus with an EF of 30%-35%  . Hyperlipidemia   . Hypertension     Past Surgical History:  Procedure Laterality Date  . CORONARY ANGIOPLASTY      Current Medications: Outpatient Medications Prior to Visit  Medication Sig Dispense Refill  . atorvastatin (LIPITOR) 40 MG tablet TAKE 1 TABLET BY MOUTH AT BEDTIME 30 tablet 11  . benazepril (LOTENSIN) 20 MG tablet TAKE 1 TABLET BY MOUTH EVERY DAY 30 tablet 11  . carvedilol (COREG) 25 MG tablet Take 1  tablet (25 mg total) by mouth 2 (two) times daily. 180 tablet 3  . fish oil-omega-3 fatty acids 1000 MG capsule Take 1 g by mouth 2 (two) times daily.    . Multiple Vitamin (MULTIVITAMIN WITH MINERALS) TABS tablet Take 1 tablet by mouth daily.    . niacin 500 MG tablet Take 500 mg by mouth daily with breakfast.    . XARELTO 20 MG TABS tablet TAKE ONE TABLET BY MOUTH ONCE DAILY WITH SUPPER 90 tablet 1  . oxymetazoline (AFRIN NASAL SPRAY) 0.05 % nasal spray Place 2 sprays into both nostrils 2 (two) times daily. 30 mL 0  . aspirin 81 MG tablet Take 81 mg by mouth daily.    Marland Kitchen. dexamethasone (DECADRON) 4 MG tablet Take 1 tablet (4 mg total) by mouth 2 (two) times daily with a meal. 12 tablet  0  . dexamethasone (DECADRON) 4 MG tablet Take 1 tablet (4 mg total) by mouth 2 (two) times daily with a meal. 12 tablet 0  . doxycycline (VIBRAMYCIN) 100 MG capsule Take 1 capsule (100 mg total) by mouth 2 (two) times daily. 14 capsule 0  . doxycycline (VIBRAMYCIN) 100 MG capsule Take 1 capsule (100 mg total) by mouth 2 (two) times daily. 14 capsule 0  . HYDROcodone-homatropine (HYCODAN) 5-1.5 MG/5ML syrup Take 5 mLs by mouth every 6 (six) hours as needed. 120 mL 0  . HYDROcodone-homatropine (HYCODAN) 5-1.5 MG/5ML syrup Take 5 mLs by mouth every 6 (six) hours as needed. 120 mL 0  . oxymetazoline (AFRIN NASAL SPRAY) 0.05 % nasal spray Place 2 sprays into both nostrils 2 (two) times daily. 30 mL 0   No facility-administered medications prior to visit.      Allergies:   Patient has no known allergies.   Social History   Socioeconomic History  . Marital status: Married    Spouse name: Not on file  . Number of children: Not on file  . Years of education: Not on file  . Highest education level: Not on file  Occupational History  . Not on file  Social Needs  . Financial resource strain: Not on file  . Food insecurity:    Worry: Not on file    Inability: Not on file  . Transportation needs:    Medical: Not on file    Non-medical: Not on file  Tobacco Use  . Smoking status: Never Smoker  . Smokeless tobacco: Never Used  Substance and Sexual Activity  . Alcohol use: No  . Drug use: Not on file  . Sexual activity: Not on file  Lifestyle  . Physical activity:    Days per week: Not on file    Minutes per session: Not on file  . Stress: Not on file  Relationships  . Social connections:    Talks on phone: Not on file    Gets together: Not on file    Attends religious service: Not on file    Active member of club or organization: Not on file    Attends meetings of clubs or organizations: Not on file    Relationship status: Not on file  Other Topics Concern  . Not on file  Social  History Narrative  . Not on file     Family History:  The patient's family history includes Cancer in his father; Heart Problems in his maternal grandmother; Heart attack in his paternal grandfather; Stroke in his maternal grandfather.   ROS:   Please see the history of present illness.  ROS All other systems reviewed and are negative.   PHYSICAL EXAM:   VS:  BP 122/82 (BP Location: Left Arm, Patient Position: Sitting)   Pulse 63   Ht 5\' 9"  (1.753 m)   Wt 226 lb 6.4 oz (102.7 kg)   SpO2 99%   BMI 33.43 kg/m    GEN: Well nourished, well developed, in no acute distress  HEENT: normal  Neck: no JVD, carotid bruits, or masses Cardiac: Irregularly irregular; no murmurs, rubs, or gallops,no edema  Respiratory:  clear to auscultation bilaterally, normal work of breathing GI: soft, nontender, nondistended, + BS MS: no deformity or atrophy  Skin: warm and dry, no rash Neuro:  Alert and Oriented x 3, Strength and sensation are intact Psych: euthymic mood, full affect  Wt Readings from Last 3 Encounters:  01/12/18 226 lb 6.4 oz (102.7 kg)  01/20/17 228 lb (103.4 kg)  09/18/16 240 lb (108.9 kg)      Studies/Labs Reviewed:   EKG:  EKG is ordered today.  The ekg ordered today demonstrates atrial fibrillation, heart rate 66  Recent Labs: 05/08/2017: BUN 9; Creatinine, Ser 0.93; Hemoglobin 18.6; Platelets 167; Potassium 5.1; Sodium 139; TSH 0.896 01/12/2018: ALT 68   Lipid Panel    Component Value Date/Time   CHOL 132 01/12/2018 1130   TRIG 204 (H) 01/12/2018 1130   HDL 30 (L) 01/12/2018 1130   CHOLHDL 4.4 01/12/2018 1130   CHOLHDL 4.9 03/17/2014 0917   VLDL NOT CALC 03/17/2014 0917   LDLCALC 61 01/12/2018 1130   LDLDIRECT 50 03/17/2014 0917    Additional studies/ records that were reviewed today include:   ETT 04/17/2017 Study Highlights    Blood pressure demonstrated a hypertensive response to exercise.  There was no ST segment deviation noted during  stress.  The patient went into atrial fibrillation with RVR 2 minutes into the study which persisted into recovery  Negative exercise treadmill test for ischemia at an adquate workload of 10.1 mets.  The patient achieved 90% MPHR and was asymptomatic  This is a low risk study.      Echo 01/01/2018 LV EF: 30% -   35% Study Conclusions  - Procedure narrative: Transthoracic echocardiography. Image   quality was poor. The study was technically difficult, as a   result of poor sound wave transmission. Intravenous contrast   (Definity) was administered to opacify the LV. - Left ventricle: The cavity size was severely dilated. Systolic   function was moderately to severely reduced. The estimated   ejection fraction was in the range of 30% to 35%. There is   akinesis of the mid-apicalinferolateral and inferior myocardium.   There is akinesis of the entireinferoseptal myocardium. There is   akinesis of the apicalanterior myocardium. There is akinesis of   the apical myocardium. The study was not technically sufficient   to allow evaluation of LV diastolic dysfunction due to atrial   fibrillation. - Left atrium: The atrium was mildly dilated. - Pulmonic valve: There was trivial regurgitation. - Pulmonary arteries: Systolic pressure could not be accurately   estimated.  Impressions:  - Severely dilated LV with moderate to severely reduced LVF with EF   30-35%. There is mid to apical akinesis of the   inferior/inferolateral walls, apical anterior, apical and   inferoseptal walls. No obvious LV thrombus by definity contrast   study but there is swirling of contrast in the LV apex concerning   for possible early forming thrombus. Consider long term   anticoagulation if  clinically indicated.    ASSESSMENT:    1. Coronary artery disease involving native coronary artery of native heart without angina pectoris   2. Essential hypertension   3. Hyperlipidemia, unspecified  hyperlipidemia type   4. PAF (paroxysmal atrial fibrillation) (HCC)   5. Ischemic cardiomyopathy      PLAN:  In order of problems listed above:  1. CAD: Continue aspirin and statin.  No anginal symptom.  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  He has echocardiogram on annual basis.  Recent echocardiogram continue to show stable ejection fraction.   2. Ischemic cardiomyopathy: Baseline EF 30 to 35%.  Continue benazepril and carvedilol.  3. Long-standing persistent atrial fibrillation: On Xarelto and carvedilol.  She is currently in atrial fibrillation at this point for unknown duration.  He has no cardiac awareness.  Continue rate control strategy given asymptomatic nature.  4. Hypertension: Blood pressure stable  5. Hyperlipidemia: Continue Lipitor.  Obtain fasting lipid panel and LFT.    Medication Adjustments/Labs and Tests Ordered: Current medicines are reviewed at length with the patient today.  Concerns regarding medicines are outlined above.  Medication changes, Labs and Tests ordered today are listed in the Patient Instructions below. Patient Instructions  Medication Instructions: Azalee Course, PA recommends that you continue on your current medications as directed. Please refer to the Current Medication list given to you today.  Labwork: Your physician recommends that you return for lab work at your convenience - FASTING.   Testing/Procedures: NONE ORDERED  Follow-up: Wynema Birch recommends that you schedule a follow-up appointment in 12 months with Dr Allyson Sabal.  If you need a refill on your cardiac medications before your next appointment, please call your pharmacy.    Ramond Dial, [place]  01/14/2018 2:09 PM    [facility] [address] [place], [place], [place]  [address] Phone: [phone]; Fax: [phone]

## 2018-01-14 ENCOUNTER — Encounter: Payer: Self-pay | Admitting: Physician Assistant

## 2018-01-16 ENCOUNTER — Other Ambulatory Visit (INDEPENDENT_AMBULATORY_CARE_PROVIDER_SITE_OTHER): Payer: BLUE CROSS/BLUE SHIELD

## 2018-01-16 DIAGNOSIS — I10 Essential (primary) hypertension: Secondary | ICD-10-CM

## 2018-01-16 DIAGNOSIS — I480 Paroxysmal atrial fibrillation: Secondary | ICD-10-CM

## 2018-01-16 DIAGNOSIS — I255 Ischemic cardiomyopathy: Secondary | ICD-10-CM

## 2018-01-16 NOTE — Progress Notes (Signed)
k

## 2018-01-18 ENCOUNTER — Other Ambulatory Visit: Payer: Self-pay

## 2018-01-18 DIAGNOSIS — E7800 Pure hypercholesterolemia, unspecified: Secondary | ICD-10-CM

## 2018-01-18 DIAGNOSIS — Z79899 Other long term (current) drug therapy: Secondary | ICD-10-CM

## 2018-01-18 DIAGNOSIS — R748 Abnormal levels of other serum enzymes: Secondary | ICD-10-CM

## 2018-01-18 DIAGNOSIS — E785 Hyperlipidemia, unspecified: Secondary | ICD-10-CM

## 2018-01-26 ENCOUNTER — Ambulatory Visit: Payer: BLUE CROSS/BLUE SHIELD | Admitting: Cardiovascular Disease

## 2018-01-26 ENCOUNTER — Encounter: Payer: Self-pay | Admitting: Cardiovascular Disease

## 2018-01-26 VITALS — BP 94/64 | HR 50 | Ht 69.5 in | Wt 228.2 lb

## 2018-01-26 DIAGNOSIS — I482 Chronic atrial fibrillation, unspecified: Secondary | ICD-10-CM

## 2018-01-26 DIAGNOSIS — E7800 Pure hypercholesterolemia, unspecified: Secondary | ICD-10-CM

## 2018-01-26 DIAGNOSIS — I255 Ischemic cardiomyopathy: Secondary | ICD-10-CM | POA: Diagnosis not present

## 2018-01-26 DIAGNOSIS — I10 Essential (primary) hypertension: Secondary | ICD-10-CM

## 2018-01-26 NOTE — Patient Instructions (Signed)
Medication Instructions:   NO CHANGE  Testing/Procedures:  Your physician has requested that you have an echocardiogram. Echocardiography is a painless test that uses sound waves to create images of your heart. It provides your doctor with information about the size and shape of your heart and how well your heart's chambers and valves are working. This procedure takes approximately one hour. There are no restrictions for this procedure.SCHEDULE IN ONE YEAR    Follow-Up:  Your physician wants you to follow-up in: ONE YEAR WITH DR Allyson SabalBERRY AFTER ECHO COMPLETE You will receive a reminder letter in the mail two months in advance. If you don't receive a letter, please call our office to schedule the follow-up appointment.   If you need a refill on your cardiac medications before your next appointment, please call your pharmacy.

## 2018-01-26 NOTE — Assessment & Plan Note (Signed)
History of hyperlipidemia on statin therapy with lipid profile performed 01/12/2018 revealing total cholesterol 132, LDL 61 and HDL 30.

## 2018-01-26 NOTE — Assessment & Plan Note (Signed)
History of essential hypertension her blood pressure measured today at 94/64.  He is on benazepril and carvedilol.  Continue current meds at current dosing.

## 2018-01-26 NOTE — Assessment & Plan Note (Signed)
History of chronic A. fib rate controlled on Xarelto oral anticoagulation. 

## 2018-01-26 NOTE — Assessment & Plan Note (Signed)
History of ischemic heart myopathy with an EF that is persistently low at 30 to 35% most recently checked 01/01/2018.  He has no symptoms of heart failure.  We have discussed ICD therapy for primary prevention which he is not interested in pursuing.

## 2018-01-26 NOTE — Progress Notes (Signed)
01/26/2018 William Hill   [birth year]  [id number]  [facility], [facility] Primary Cardiologist: Runell Gess MD Milagros Loll, [place], [place]  HPI:  William [name] a 52 y.o.  mildly overweight, divorced [place], father of 3 stepchildren, who I saw  01/20/2017.  He is accompanied by his wife William Hill today.  He has a history of CAD status post anterior wall myocardial infarction back in 1999 with PCI and stenting at [facility]. His other problems include discontinued tobacco abuse, hypertension, and hyperlipidemia. 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 His last lipid profile performed 04/22/13 revealed a total cholesterol of 145, LDL 75 HDL of 37. A Myoview stress test performed in our office 04/04/13 revealed scar in the LAD territory without ischemia. Since I saw him in the office one year ago he's remained currently stable denying chest pain or shortness of breath. When I saw him one year ago he was in atrial fibrillation with a controlled ventricular response but was asymptomatic. I did begin him on Xarelto.  His most recent 2D echo performed 01/01/2018 shows persistently reduced EF of 30 to 35% which is not changed.  He is otherwise asymptomatic.      Current Meds  Medication Sig  . aspirin 81 MG tablet Take 81 mg by mouth daily.  Marland Kitchen atorvastatin (LIPITOR) 40 MG tablet TAKE 1 TABLET BY MOUTH AT BEDTIME  . benazepril (LOTENSIN) 20 MG tablet TAKE 1 TABLET BY MOUTH EVERY DAY  . carvedilol (COREG) 25 MG tablet Take 1 tablet (25 mg total) by mouth 2 (two) times daily.  . fish oil-omega-3 fatty acids 1000 MG capsule Take 1 g by mouth 2 (two) times daily.  . Multiple Vitamin (MULTIVITAMIN WITH MINERALS) TABS tablet Take 1 tablet by mouth daily.  . niacin 500 MG tablet Take 500 mg by mouth daily with breakfast.    . XARELTO 20 MG TABS tablet TAKE ONE TABLET BY MOUTH ONCE DAILY WITH SUPPER     No Known Allergies  Social History   Socioeconomic History  . Marital status: Married    Spouse name: Not on file  . Number of children: Not on file  . Years of education: Not on file  . Highest education level: Not on file  Occupational History  . Not on file  Social Needs  . Financial resource strain: Not on file  . Food insecurity:    Worry: Not on file    Inability: Not on file  . Transportation needs:    Medical: Not on file    Non-medical: Not on file  Tobacco Use  . Smoking status: Never Smoker  . Smokeless tobacco: Never Used  Substance and Sexual Activity  . Alcohol use: No  . Drug use: Not on file  . Sexual activity: Not on file  Lifestyle  . Physical activity:    Days per week: Not on file    Minutes per session: Not on file  . Stress: Not on file  Relationships  . Social connections:    Talks on phone: Not on file    Gets together: Not on file    Attends religious service: Not on file    Active member of club or organization: Not on file    Attends meetings of clubs or organizations: Not on file  Relationship status: Not on file  . Intimate partner violence:    Fear of current or ex partner: Not on file    Emotionally abused: Not on file    Physically abused: Not on file    Forced sexual activity: Not on file  Other Topics Concern  . Not on file  Social History Narrative  . Not on file     Review of Systems: General: negative for chills, fever, night sweats or weight changes.  Cardiovascular: negative for chest pain, dyspnea on exertion, edema, orthopnea, palpitations, paroxysmal nocturnal dyspnea or shortness of breath Dermatological: negative for rash Respiratory: negative for cough or wheezing Urologic: negative for hematuria Abdominal: negative for nausea, vomiting, diarrhea, bright red blood per rectum, melena, or hematemesis Neurologic: negative for visual  changes, syncope, or dizziness All other systems reviewed and are otherwise negative except as noted above.    Blood pressure 94/64, pulse (!) 50, height 5' 9.5" (1.765 m), weight 228 lb 3.2 oz (103.5 kg).  General appearance: alert and no distress Neck: no adenopathy, no carotid bruit, no JVD, supple, symmetrical, trachea midline and thyroid not enlarged, symmetric, no tenderness/mass/nodules Lungs: clear to auscultation bilaterally Heart: regular rate and rhythm, S1, S2 normal, no murmur, click, rub or gallop Extremities: extremities normal, atraumatic, no cyanosis or edema Pulses: 2+ and symmetric Skin: Skin color, texture, turgor normal. No rashes or lesions Neurologic: Grossly normal  EKG not performed today  ASSESSMENT AND PLAN:   Essential hypertension History of essential hypertension her blood pressure measured today at 94/64.  He is on benazepril and carvedilol.  Continue current meds at current dosing.  Hyperlipidemia History of hyperlipidemia on statin therapy with lipid profile performed 01/12/2018 revealing total cholesterol 132, LDL 61 and HDL 30.  Cardiomyopathy, ischemic History of ischemic heart myopathy with an EF that is persistently low at 30 to 35% most recently checked 01/01/2018.  He has no symptoms of heart failure.  We have discussed ICD therapy for primary prevention which he is not interested in pursuing.  Atrial fibrillation (HCC) History of chronic A. fib rate controlled on Xarelto oral anticoagulation.      Runell GessJonathan J. Nilan Iddings MD FACP,FACC,FAHA, [facility] 01/26/2018 10:28 AM

## 2018-02-11 ENCOUNTER — Other Ambulatory Visit: Payer: Self-pay | Admitting: Cardiovascular Disease

## 2018-02-12 NOTE — Telephone Encounter (Signed)
Rx sent to pharmacy   

## 2018-04-14 ENCOUNTER — Other Ambulatory Visit: Payer: Self-pay | Admitting: Cardiovascular Disease

## 2018-08-30 ENCOUNTER — Other Ambulatory Visit: Payer: Self-pay

## 2018-08-30 ENCOUNTER — Encounter (HOSPITAL_COMMUNITY): Payer: Self-pay

## 2018-08-30 ENCOUNTER — Inpatient Hospital Stay (HOSPITAL_COMMUNITY)
Admission: EM | Admit: 2018-08-30 | Discharge: 2018-09-03 | DRG: 815 | Disposition: A | Discharge status: Home / Self Care | Payer: BLUE CROSS/BLUE SHIELD | Attending: Family Medicine | Admitting: Family Medicine

## 2018-08-30 DIAGNOSIS — L02511 Cutaneous abscess of right hand: Secondary | ICD-10-CM | POA: Diagnosis present

## 2018-08-30 DIAGNOSIS — A281 Cat-scratch disease: Secondary | ICD-10-CM | POA: Diagnosis not present

## 2018-08-30 DIAGNOSIS — Z7982 Long term (current) use of aspirin: Secondary | ICD-10-CM

## 2018-08-30 DIAGNOSIS — I252 Old myocardial infarction: Secondary | ICD-10-CM

## 2018-08-30 DIAGNOSIS — I2510 Atherosclerotic heart disease of native coronary artery without angina pectoris: Secondary | ICD-10-CM | POA: Diagnosis present

## 2018-08-30 DIAGNOSIS — I482 Chronic atrial fibrillation, unspecified: Secondary | ICD-10-CM | POA: Diagnosis present

## 2018-08-30 DIAGNOSIS — Z79899 Other long term (current) drug therapy: Secondary | ICD-10-CM

## 2018-08-30 DIAGNOSIS — Z7901 Long term (current) use of anticoagulants: Secondary | ICD-10-CM

## 2018-08-30 DIAGNOSIS — W5503XA Scratched by cat, initial encounter: Secondary | ICD-10-CM

## 2018-08-30 DIAGNOSIS — I890 Lymphedema, not elsewhere classified: Secondary | ICD-10-CM | POA: Diagnosis present

## 2018-08-30 DIAGNOSIS — L03011 Cellulitis of right finger: Secondary | ICD-10-CM | POA: Diagnosis present

## 2018-08-30 DIAGNOSIS — Z9861 Coronary angioplasty status: Secondary | ICD-10-CM

## 2018-08-30 DIAGNOSIS — T368X5A Adverse effect of other systemic antibiotics, initial encounter: Secondary | ICD-10-CM | POA: Diagnosis not present

## 2018-08-30 DIAGNOSIS — L089 Local infection of the skin and subcutaneous tissue, unspecified: Secondary | ICD-10-CM

## 2018-08-30 DIAGNOSIS — T148XXA Other injury of unspecified body region, initial encounter: Secondary | ICD-10-CM

## 2018-08-30 DIAGNOSIS — E785 Hyperlipidemia, unspecified: Secondary | ICD-10-CM | POA: Diagnosis present

## 2018-08-30 DIAGNOSIS — I10 Essential (primary) hypertension: Secondary | ICD-10-CM | POA: Diagnosis present

## 2018-08-30 DIAGNOSIS — Z23 Encounter for immunization: Secondary | ICD-10-CM

## 2018-08-30 DIAGNOSIS — T360X5A Adverse effect of penicillins, initial encounter: Secondary | ICD-10-CM | POA: Diagnosis not present

## 2018-08-30 DIAGNOSIS — N179 Acute kidney failure, unspecified: Secondary | ICD-10-CM | POA: Diagnosis not present

## 2018-08-30 LAB — CBC WITH DIFFERENTIAL/PLATELET
Abs Immature Granulocytes: 0.11 10*3/uL — ABNORMAL HIGH (ref 0.00–0.07)
Basophils Absolute: 0.1 10*3/uL (ref 0.0–0.1)
Basophils Relative: 1 %
EOS ABS: 0 10*3/uL (ref 0.0–0.5)
Eosinophils Relative: 0 %
HEMATOCRIT: 46.4 % (ref 39.0–52.0)
Hemoglobin: 16.2 g/dL (ref 13.0–17.0)
Immature Granulocytes: 1 %
Lymphocytes Relative: 9 %
Lymphs Abs: 1.4 10*3/uL (ref 0.7–4.0)
MCH: 33.2 pg (ref 26.0–34.0)
MCHC: 34.9 g/dL (ref 30.0–36.0)
MCV: 95.1 fL (ref 80.0–100.0)
Monocytes Absolute: 0.8 10*3/uL (ref 0.1–1.0)
Monocytes Relative: 5 %
Neutro Abs: 12.8 10*3/uL — ABNORMAL HIGH (ref 1.7–7.7)
Neutrophils Relative %: 84 %
Platelets: 167 10*3/uL (ref 150–400)
RBC: 4.88 MIL/uL (ref 4.22–5.81)
RDW: 12.1 % (ref 11.5–15.5)
WBC: 15.1 10*3/uL — ABNORMAL HIGH (ref 4.0–10.5)
nRBC: 0 % (ref 0.0–0.2)

## 2018-08-30 MED ORDER — AZITHROMYCIN 250 MG PO TABS
500.0000 mg | ORAL_TABLET | Freq: Once | ORAL | Status: AC
  Administered 2018-08-30: 500 mg via ORAL
  Filled 2018-08-30: qty 2

## 2018-08-30 MED ORDER — VANCOMYCIN HCL IN DEXTROSE 1-5 GM/200ML-% IV SOLN: 1000.0000 mg | Freq: Once | INTRAVENOUS | Status: DC

## 2018-08-30 MED ORDER — POVIDONE-IODINE 10 % EX SOLN
CUTANEOUS | Status: AC
  Administered 2018-08-30
  Filled 2018-08-30: qty 30

## 2018-08-30 MED ORDER — TETANUS-DIPHTH-ACELL PERTUSSIS 5-2.5-18.5 LF-MCG/0.5 IM SUSP
0.5000 mL | Freq: Once | INTRAMUSCULAR | Status: AC
Start: 2018-08-30 — End: 2018-08-30
  Administered 2018-08-30: 0.5 mL via INTRAMUSCULAR
  Filled 2018-08-30: qty 0.5

## 2018-08-30 MED ORDER — VANCOMYCIN HCL IN DEXTROSE 1-5 GM/200ML-% IV SOLN
1000.0000 mg | Freq: Once | INTRAVENOUS | Status: AC
  Administered 2018-08-31: 1000 mg via INTRAVENOUS
  Filled 2018-08-30: qty 200

## 2018-08-30 MED ORDER — DOXYCYCLINE HYCLATE 100 MG PO TABS
100.0000 mg | ORAL_TABLET | Freq: Once | ORAL | Status: AC
  Administered 2018-08-30: 100 mg via ORAL
  Filled 2018-08-30: qty 1

## 2018-08-30 MED ORDER — PIPERACILLIN-TAZOBACTAM 3.375 G IVPB 30 MIN
3.3750 g | Freq: Once | INTRAVENOUS | Status: AC
  Administered 2018-08-30: 3.375 g via INTRAVENOUS
  Filled 2018-08-30: qty 50

## 2018-08-30 NOTE — ED Triage Notes (Signed)
Pt states he was scratched by his cat yesterday, has redness to his right forearm with several scratches present.  Pt also has a wound to 4th finger of left hand with scratch wound present.  Pt denies bite wounds.  Pt states it is his cat.

## 2018-08-30 NOTE — ED Provider Notes (Signed)
[facility] EMERGENCY DEPARTMENT Provider Note   CSN: [id number] Arrival date & time: 08/30/18  2250    History   Chief Complaint Chief Complaint  Patient presents with  . Wound Check    cat scratches    HPI William [name] a 53 y.o. male.     Patient presents with cat scratches to his right forearm as well as left fourth finger.  States this happened about 24 hours ago by his own cat that is a kitten.  Denies any bites and states these were scratches.  Comes in today with spreading redness up his right forearm and streaking erythema.  Has had a draining wound to his fourth digit on the left hand.  Reports a fever today of 103.9.  Denies any nausea or vomiting.  No chest pain or shortness of breath.  No focal weakness, numbness or tingling.  Patient states he cleaned the wounds with alcohol has been applying a topical antibiotic. Patient does take Xarelto for history of atrial fibrillation and CAD.  Tetanus not up-to-date.  The history is provided by the patient.    Past Medical History:  Diagnosis Date  . CAD (coronary artery disease)    post anterior wall myocardial infarction in 1999 with PCI and stenting at [facility].  . Heart attack (HCC)   . History of stress test 12/2010   EF of 36% with apical scar  . Hx of echocardiogram 02/13/2012   EF 35%. There is apical dyskinesis  . Hx of echocardiogram 05/09/2012   difinity contrast that showed no apical mural thrombus with an EF of 30%-35%  . Hyperlipidemia   . Hypertension     Patient Active Problem List   Diagnosis Date Noted  . Atrial fibrillation (HCC) 01/19/2016  . Essential hypertension 02/11/2013  . Hyperlipidemia 02/11/2013  . Cardiomyopathy, ischemic 02/11/2013    Past Surgical History:  Procedure Laterality Date  . CORONARY ANGIOPLASTY          Home Medications    Prior to Admission medications   Medication Sig Start Date End Date Taking? Authorizing Provider  aspirin 81 MG tablet Take 81  mg by mouth daily.    [provider]  atorvastatin (LIPITOR) 40 MG tablet TAKE 1 TABLET BY MOUTH AT BEDTIME 02/12/18   Runell Gess, MD  benazepril (LOTENSIN) 20 MG tablet TAKE 1 TABLET BY MOUTH EVERY DAY 02/12/18   Runell Gess, MD  carvedilol (COREG) 25 MG tablet Take 1 tablet (25 mg total) by mouth 2 (two) times daily. 11/27/17 02/25/18  Runell Gess, MD  fish oil-omega-3 fatty acids 1000 MG capsule Take 1 g by mouth 2 (two) times daily.    [provider]  Multiple Vitamin (MULTIVITAMIN WITH MINERALS) TABS tablet Take 1 tablet by mouth daily.    [provider]  niacin 500 MG tablet Take 500 mg by mouth daily with breakfast.    [provider]  XARELTO 20 MG TABS tablet TAKE ONE TABLET BY MOUTH ONCE DAILY WITH SUPPER 04/17/18   Runell Gess, MD    Family History Family History  Problem Relation Age of Onset  . Cancer Father   . Heart Problems Maternal Grandmother   . Stroke Maternal Grandfather   . Heart attack Paternal Grandfather     Social History Social History   Tobacco Use  . Smoking status: Never Smoker  . Smokeless tobacco: Never Used  Substance Use Topics  . Alcohol use: No  .  Drug use: Never     Allergies   Patient has no known allergies.   Review of Systems Review of Systems  Constitutional: Negative for activity change, appetite change and fever.  HENT: Negative for congestion and rhinorrhea.   Eyes: Negative for visual disturbance.  Respiratory: Negative for cough, chest tightness and shortness of breath.   Cardiovascular: Negative for chest pain.  Gastrointestinal: Negative for abdominal pain, nausea and vomiting.  Genitourinary: Negative for dysuria and hematuria.  Musculoskeletal: Positive for arthralgias and myalgias.  Skin: Positive for wound.  Neurological: Negative for dizziness, weakness and headaches.    all other systems are negative except as noted in the HPI and PMH.    Physical  Exam Updated Vital Signs BP 124/76   Pulse 98   Temp 98.6 F (37 C) (Oral)   Resp 20   Ht 5' 9.5" (1.765 m)   Wt 104.3 kg   SpO2 97%   BMI 33.48 kg/m   Physical Exam Vitals signs and nursing note reviewed.  Constitutional:      General: He is not in acute distress.    Appearance: He is well-developed. He is obese.  HENT:     Head: Normocephalic and atraumatic.     Mouth/Throat:     Pharynx: No oropharyngeal exudate.  Eyes:     Conjunctiva/sclera: Conjunctivae normal.     Pupils: Pupils are equal, round, and reactive to light.  Neck:     Musculoskeletal: Normal range of motion and neck supple.     Comments: No meningismus. Cardiovascular:     Rate and Rhythm: Normal rate and regular rhythm.     Heart sounds: Normal heart sounds. No murmur.  Pulmonary:     Effort: Pulmonary effort is normal. No respiratory distress.     Breath sounds: Normal breath sounds.  Abdominal:     Palpations: Abdomen is soft.     Tenderness: There is no abdominal tenderness. There is no guarding or rebound.  Musculoskeletal: Normal range of motion.        General: No tenderness.  Skin:    General: Skin is warm.     Capillary Refill: Capillary refill takes less than 2 seconds.     Comments: Erythema over the right dorsal wrist with streaking up the forearm to the mid upper arm.  There is full range of motion of the fingers, wrist and elbow.  Intact radial pulse.  No areas of fluctuance.  Draining wound to dorsal left fourth digit that is expressing serosanguineous drainage.  There is full range of motion of the MCP, PIP and DIP joint.  There is slight tenderness along the dorsum of the finger with erythema.  No tenderness along flexor tendon sheath.  Neurological:     General: No focal deficit present.     Mental Status: He is alert and oriented to person, place, and time. Mental status is at baseline.     Cranial Nerves: No cranial nerve deficit.     Motor: No abnormal muscle tone.      Coordination: Coordination normal.     Comments: No ataxia on finger to nose bilaterally. No pronator drift. 5/5 strength throughout. CN 2-12 intact.Equal grip strength. Sensation intact.   Psychiatric:        Behavior: Behavior normal.              ED Treatments / Results  Labs (all labs ordered are listed, but only abnormal results are displayed) Labs Reviewed  CBC WITH DIFFERENTIAL/PLATELET -  Abnormal; Notable for the following components:      Result Value   WBC 15.1 (*)    Neutro Abs 12.8 (*)    Abs Immature Granulocytes 0.11 (*)    All other components within normal limits  BASIC METABOLIC PANEL - Abnormal; Notable for the following components:   Glucose, Bld 195 (*)    All other components within normal limits            EKG None  Radiology No results found.  Procedures Procedures (including critical care time)  Medications Ordered in ED Medications  povidone-iodine (BETADINE) 10 % external solution (has no administration in time range)  Tdap (BOOSTRIX) injection 0.5 mL (0.5 mLs Intramuscular Given 08/30/18 2318)  azithromycin (ZITHROMAX) tablet 500 mg (500 mg Oral Given 08/30/18 2319)  doxycycline (VIBRA-TABS) tablet 100 mg (100 mg Oral Given 08/30/18 2319)     Initial Impression / Assessment and Plan / ED Course  I have reviewed the triage vital signs and the nursing notes.  Pertinent labs & imaging results that were available during my care of the patient were reviewed by me and considered in my medical decision making (see chart for details).       Patient with cat scratches to his bilateral arms.  Fever to 103.9.  There is streaking lymphangitis up his right arm. Neurovascularly intact.  Tetanus needs updating. Labs will be obtained.  Patient was started on antibiotics Including Zithromax for possible cat scratch fever.  Concern given the rapid progression of cellulitis and lymphangitis that patient would benefit from IV antibiotics.  He  is agreeable.  Antibiotics escalated to Vanco and Zosyn.  There is no evidence of deep space infection or flexor tenosynovitis at this time. Given his rapidly spreading cellulitis and lymphangitis, will admit to the hospitalist.  Discussed with Dr. Sharl Ma.  Final Clinical Impressions(s) / ED Diagnoses   Final diagnoses:  Wound infection  Cat-scratch disease    ED Discharge Orders    None       Nakota Ackert, Jeannett Senior, MD 08/31/18 [phone]

## 2018-08-31 DIAGNOSIS — A281 Cat-scratch disease: Secondary | ICD-10-CM | POA: Diagnosis present

## 2018-08-31 DIAGNOSIS — E785 Hyperlipidemia, unspecified: Secondary | ICD-10-CM | POA: Diagnosis present

## 2018-08-31 DIAGNOSIS — T148XXA Other injury of unspecified body region, initial encounter: Secondary | ICD-10-CM | POA: Diagnosis not present

## 2018-08-31 DIAGNOSIS — Z9861 Coronary angioplasty status: Secondary | ICD-10-CM | POA: Diagnosis not present

## 2018-08-31 DIAGNOSIS — L089 Local infection of the skin and subcutaneous tissue, unspecified: Secondary | ICD-10-CM | POA: Diagnosis not present

## 2018-08-31 DIAGNOSIS — Z7901 Long term (current) use of anticoagulants: Secondary | ICD-10-CM | POA: Diagnosis not present

## 2018-08-31 DIAGNOSIS — N179 Acute kidney failure, unspecified: Secondary | ICD-10-CM | POA: Diagnosis not present

## 2018-08-31 DIAGNOSIS — L03011 Cellulitis of right finger: Secondary | ICD-10-CM | POA: Diagnosis present

## 2018-08-31 DIAGNOSIS — L02511 Cutaneous abscess of right hand: Secondary | ICD-10-CM | POA: Diagnosis present

## 2018-08-31 DIAGNOSIS — Z79899 Other long term (current) drug therapy: Secondary | ICD-10-CM | POA: Diagnosis not present

## 2018-08-31 DIAGNOSIS — W5503XA Scratched by cat, initial encounter: Secondary | ICD-10-CM | POA: Diagnosis not present

## 2018-08-31 DIAGNOSIS — T360X5A Adverse effect of penicillins, initial encounter: Secondary | ICD-10-CM | POA: Diagnosis not present

## 2018-08-31 DIAGNOSIS — I252 Old myocardial infarction: Secondary | ICD-10-CM | POA: Diagnosis not present

## 2018-08-31 DIAGNOSIS — I2510 Atherosclerotic heart disease of native coronary artery without angina pectoris: Secondary | ICD-10-CM | POA: Diagnosis present

## 2018-08-31 DIAGNOSIS — I10 Essential (primary) hypertension: Secondary | ICD-10-CM | POA: Diagnosis present

## 2018-08-31 DIAGNOSIS — T368X5A Adverse effect of other systemic antibiotics, initial encounter: Secondary | ICD-10-CM | POA: Diagnosis not present

## 2018-08-31 DIAGNOSIS — Z23 Encounter for immunization: Secondary | ICD-10-CM | POA: Diagnosis not present

## 2018-08-31 DIAGNOSIS — I890 Lymphedema, not elsewhere classified: Secondary | ICD-10-CM | POA: Diagnosis present

## 2018-08-31 DIAGNOSIS — I482 Chronic atrial fibrillation, unspecified: Secondary | ICD-10-CM | POA: Diagnosis present

## 2018-08-31 DIAGNOSIS — Z7982 Long term (current) use of aspirin: Secondary | ICD-10-CM | POA: Diagnosis not present

## 2018-08-31 LAB — CBC
HCT: 48.6 % (ref 39.0–52.0)
Hemoglobin: 17.3 g/dL — ABNORMAL HIGH (ref 13.0–17.0)
MCH: 34.5 pg — ABNORMAL HIGH (ref 26.0–34.0)
MCHC: 35.6 g/dL (ref 30.0–36.0)
MCV: 96.8 fL (ref 80.0–100.0)
Platelets: 168 10*3/uL (ref 150–400)
RBC: 5.02 MIL/uL (ref 4.22–5.81)
RDW: 12.2 % (ref 11.5–15.5)
WBC: 18.2 10*3/uL — ABNORMAL HIGH (ref 4.0–10.5)
nRBC: 0 % (ref 0.0–0.2)

## 2018-08-31 LAB — COMPREHENSIVE METABOLIC PANEL
ALT: 59 U/L — ABNORMAL HIGH (ref 0–44)
AST: 36 U/L (ref 15–41)
Albumin: 4.2 g/dL (ref 3.5–5.0)
Alkaline Phosphatase: 52 U/L (ref 38–126)
Anion gap: 10 (ref 5–15)
BUN: 13 mg/dL (ref 6–20)
CO2: 23 mmol/L (ref 22–32)
Calcium: 9.2 mg/dL (ref 8.9–10.3)
Chloride: 102 mmol/L (ref 98–111)
Creatinine, Ser: 1.11 mg/dL (ref 0.61–1.24)
GFR calc Af Amer: >60 mL/min (ref >60)
Glucose, Bld: 196 mg/dL — ABNORMAL HIGH (ref 70–99)
Potassium: 3.9 mmol/L (ref 3.5–5.1)
Sodium: 135 mmol/L (ref 135–145)
Total Bilirubin: 1.1 mg/dL (ref 0.3–1.2)
Total Protein: 7.2 g/dL (ref 6.5–8.1)

## 2018-08-31 LAB — BASIC METABOLIC PANEL
Anion gap: 10 (ref 5–15)
BUN: 13 mg/dL (ref 6–20)
CO2: 25 mmol/L (ref 22–32)
CREATININE: 1.03 mg/dL (ref 0.61–1.24)
Calcium: 9 mg/dL (ref 8.9–10.3)
Chloride: 100 mmol/L (ref 98–111)
GFR calc Af Amer: >60 mL/min (ref >60)
GFR calc non Af Amer: >60 mL/min (ref >60)
Glucose, Bld: 195 mg/dL — ABNORMAL HIGH (ref 70–99)
Potassium: 3.7 mmol/L (ref 3.5–5.1)
Sodium: 135 mmol/L (ref 135–145)

## 2018-08-31 MED ORDER — CARVEDILOL 12.5 MG PO TABS
25.0000 mg | ORAL_TABLET | Freq: Two times a day (BID) | ORAL | Status: DC
  Administered 2018-08-31 – 2018-09-03 (×7): 25 mg via ORAL
  Filled 2018-08-31 (×7): qty 2

## 2018-08-31 MED ORDER — PIPERACILLIN-TAZOBACTAM 3.375 G IVPB
3.3750 g | Freq: Three times a day (TID) | INTRAVENOUS | Status: DC
  Administered 2018-08-31 – 2018-09-03 (×10): 3.375 g via INTRAVENOUS
  Filled 2018-08-31 (×10): qty 50

## 2018-08-31 MED ORDER — ACETAMINOPHEN 325 MG PO TABS
650.0000 mg | ORAL_TABLET | Freq: Once | ORAL | Status: AC
  Administered 2018-08-31: 650 mg via ORAL
  Filled 2018-08-31: qty 2

## 2018-08-31 MED ORDER — ACETAMINOPHEN 325 MG PO TABS
650.0000 mg | ORAL_TABLET | Freq: Four times a day (QID) | ORAL | Status: DC | PRN
  Administered 2018-08-31 – 2018-09-01 (×2): 650 mg via ORAL
  Filled 2018-08-31 (×2): qty 2

## 2018-08-31 MED ORDER — BENAZEPRIL HCL 10 MG PO TABS
20.0000 mg | ORAL_TABLET | Freq: Every day | ORAL | Status: DC
  Administered 2018-08-31 – 2018-09-02 (×3): 20 mg via ORAL
  Filled 2018-08-31 (×6): qty 2

## 2018-08-31 MED ORDER — SODIUM CHLORIDE 0.9 % IV SOLN
INTRAVENOUS | Status: DC
  Administered 2018-08-31 – 2018-09-01 (×3): via INTRAVENOUS

## 2018-08-31 MED ORDER — NIACIN 500 MG PO TABS
500.0000 mg | ORAL_TABLET | Freq: Every day | ORAL | Status: DC
  Administered 2018-09-01 – 2018-09-03 (×3): 500 mg via ORAL
  Filled 2018-08-31 (×5): qty 1

## 2018-08-31 MED ORDER — VANCOMYCIN HCL 10 G IV SOLR
1500.0000 mg | Freq: Two times a day (BID) | INTRAVENOUS | Status: DC
  Administered 2018-08-31 – 2018-09-01 (×2): 1500 mg via INTRAVENOUS
  Filled 2018-08-31 (×7): qty 1500

## 2018-08-31 MED ORDER — OMEGA-3-ACID ETHYL ESTERS 1 G PO CAPS
1.0000 g | ORAL_CAPSULE | Freq: Two times a day (BID) | ORAL | Status: DC
  Administered 2018-08-31 – 2018-09-03 (×7): 1 g via ORAL
  Filled 2018-08-31 (×13): qty 1

## 2018-08-31 MED ORDER — ACETAMINOPHEN 650 MG RE SUPP: 650.0000 mg | Freq: Four times a day (QID) | RECTAL | Status: DC | PRN

## 2018-08-31 MED ORDER — ATORVASTATIN CALCIUM 40 MG PO TABS
40.0000 mg | ORAL_TABLET | Freq: Every day | ORAL | Status: DC
  Administered 2018-08-31 – 2018-09-02 (×3): 40 mg via ORAL
  Filled 2018-08-31 (×3): qty 1

## 2018-08-31 MED ORDER — ADULT MULTIVITAMIN W/MINERALS CH
1.0000 | ORAL_TABLET | Freq: Every day | ORAL | Status: DC
  Administered 2018-08-31 – 2018-09-03 (×4): 1 via ORAL
  Filled 2018-08-31 (×4): qty 1

## 2018-08-31 MED ORDER — RIVAROXABAN 20 MG PO TABS
20.0000 mg | ORAL_TABLET | Freq: Every day | ORAL | Status: DC
  Administered 2018-08-31 – 2018-09-02 (×3): 20 mg via ORAL
  Filled 2018-08-31 (×6): qty 1

## 2018-08-31 MED ORDER — AZITHROMYCIN 250 MG PO TABS
250.0000 mg | ORAL_TABLET | Freq: Every day | ORAL | Status: AC
  Administered 2018-08-31 – 2018-09-03 (×4): 250 mg via ORAL
  Filled 2018-08-31 (×4): qty 1

## 2018-08-31 MED ORDER — ASPIRIN EC 81 MG PO TBEC
81.0000 mg | DELAYED_RELEASE_TABLET | Freq: Every day | ORAL | Status: DC
  Administered 2018-09-01 – 2018-09-03 (×3): 81 mg via ORAL
  Filled 2018-08-31 (×4): qty 1

## 2018-08-31 NOTE — ED Notes (Signed)
Gave pt medications that are verified at this time. Waiting for other patient medications to be verified.

## 2018-08-31 NOTE — Progress Notes (Signed)
  Patient seen and evaluated, chart reviewed, please see EMR for updated orders. Please see full H&P dictated by admitting physician Dr Sharl Ma for same date of service.    1)CSD-----cat scratch disease of the right upper extremity mostly with lymphadenopathy and lymphangitis, continue azithromycin  2) Rt fourth finger cellulitis/abscess----on admission patient was started on Vanco and Zosyn, MRSA PCR screen pending, WBC is up to 18.2 K from 15.1, T-max 100.1----  3)Chronic A. Fib----continue Coreg for rate control and Xarelto for anticoagulation  4)HTN--table, continue benazepril and Coreg for BP control  5)H/o CAD--- no chest pains or ACS symptoms at this time, continue aspirin, Lipitor niacin and Coreg   Patient seen and evaluated, chart reviewed, please see EMR for updated orders. Please see full H&P dictated by admitting physician Dr Sharl Ma for same date of service.

## 2018-08-31 NOTE — Progress Notes (Signed)
Pharmacy Antibiotic Note  William [name] a 53 y.o. male admitted on 08/30/2018 with cat scratches with streaking erythema and a draining wound on his left hand.  Pharmacy has been consulted for zosyn and vancomycin dosing for wound infection.  Plan: Zosyn 3.375g IV q8h (4 hour infusion). (He received a dose of 3.375gm x 1 over 30 mins in ED). Vancomycin 2000 mg load, followed by 1500 mg q 12hr.  Height: 5' 9.5" (176.5 cm) Weight: 230 lb (104.3 kg) IBW/kg (Calculated) : 71.85  Temp (24hrs), Avg:98.6 F (37 C), Min:98.6 F (37 C), Max:98.6 F (37 C)  Recent Labs  Lab 08/30/18 2324  WBC 15.1*  CREATININE 1.03    Estimated Creatinine Clearance: 101.9 mL/min (by C-G formula based on SCr of 1.03 mg/dL).    Ke=0.076 hr-1 Vd= 75.1 L T1/2= 9.1 hrs Calculated AUC=525.9  Calculated Css max = 33.10mcg/ml Calculated Css min = 13.4 mcg/ml   No Known Allergies    Thank you for allowing pharmacy to be a part of this patient's care.  Sherrilyn Rist 08/31/2018 12:46 AM

## 2018-08-31 NOTE — H&P (Signed)
TRH H&P    Patient Demographics:    William Hill, is a 53 y.o. male  MRN: [id number]  DOB - Jan 30, 1966  Admit Date - 08/30/2018  Referring MD/NP/PA: Cherlynn Perches  Outpatient Primary MD for the patient is [facility]  Patient coming from: Home  Chief complaint-cat scratch   HPI:    William Hill  is a 53 y.o. male, with history of CAD, hypertension, hyperlipidemia, atrial fibrillation came to hospital with chief complaint of cat scratches to right forearm as well as left fourth finger.  Denies any bites.  Patient says that the redness started spreading up right forearm with streaking erythema.  Also had draining wound on left fourth finger.  Had temperature of 103.9 at home.  Also admits to feeling cold.  Denies dysuria. Denies chest pain or shortness of breath.  Patient says that he clean the wound with alcohol and has applied topical antibiotic. In the ED patient was started on vancomycin, Zosyn, Zithromax No previous history of stroke or seizures. He is on anticoagulation with Xarelto for atrial fibrillation and CAD.    Review of systems:    In addition to the HPI above,    All other systems reviewed and are negative.    Past History of the following :    Past Medical History:  Diagnosis Date  . CAD (coronary artery disease)    post anterior wall myocardial infarction in 1999 with PCI and stenting at [facility].  . Heart attack (HCC)   . History of stress test 12/2010   EF of 36% with apical scar  . Hx of echocardiogram 02/13/2012   EF 35%. There is apical dyskinesis  . Hx of echocardiogram 05/09/2012   difinity contrast that showed no apical mural thrombus with an EF of 30%-35%  . Hyperlipidemia   . Hypertension       Past Surgical History:  Procedure Laterality Date  . CORONARY ANGIOPLASTY        Social History:      Social History   Tobacco Use  .  Smoking status: Never Smoker  . Smokeless tobacco: Never Used  Substance Use Topics  . Alcohol use: No       Family History :     Family History  Problem Relation Age of Onset  . Cancer Father   . Heart Problems Maternal Grandmother   . Stroke Maternal Grandfather   . Heart attack Paternal Grandfather       Home Medications:   Prior to Admission medications   Medication Sig Start Date End Date Taking? Authorizing Provider  aspirin 81 MG tablet Take 81 mg by mouth daily.    [provider]  atorvastatin (LIPITOR) 40 MG tablet TAKE 1 TABLET BY MOUTH AT BEDTIME 02/12/18   Runell Gess, MD  benazepril (LOTENSIN) 20 MG tablet TAKE 1 TABLET BY MOUTH EVERY DAY 02/12/18   Runell Gess, MD  carvedilol (COREG) 25 MG tablet Take 1 tablet (25 mg total) by mouth 2 (two) times daily. 11/27/17 02/25/18  Runell Gess, MD  fish oil-omega-3 fatty acids 1000 MG capsule Take 1 g by mouth 2 (two) times daily.    [provider]  Multiple Vitamin (MULTIVITAMIN WITH MINERALS) TABS tablet Take 1 tablet by mouth daily.    [provider]  niacin 500 MG tablet Take 500 mg by mouth daily with breakfast.    [provider]  XARELTO 20 MG TABS tablet TAKE ONE TABLET BY MOUTH ONCE DAILY WITH SUPPER 04/17/18   Runell Gess, MD     Allergies:    No Known Allergies   Physical Exam:   Vitals  Blood pressure 122/73, pulse 100, temperature 98.3 F (36.8 C), temperature source Oral, resp. rate 18, height 5' 9.5" (1.765 m), weight 104.3 kg, SpO2 96 %.  1.  General: Appears in no acute distress  2. Psychiatric: Alert, oriented x3, intact insight and judgment  3. Neurologic: Cranial nerve II through grossly intact, motor strength 5/5 in all extremities  4. HEENMT:  Atraumatic normocephalic, extraocular muscles intact  5. Respiratory : Clear to auscultation bilaterally, no wheezing or crackles  6. Cardiovascular : S1-S2, regular, no murmur  auscultated  7. Gastrointestinal:  Abdomen is soft, nontender, no organomegaly  8. Skin:  Erythema noted in the right forearm with streaking erythema extending up to right arm. Drainage noted in the dorsal aspect of left fourth finger, edematous, tender to palpation  9.Musculoskeletal:  No limitation of range of motion in both right upper extremities.  Full range of motion of MCP, PIP and DIP joints.    Data Review:    CBC Recent Labs  Lab 08/30/18 2324  WBC 15.1*  HGB 16.2  HCT 46.4  PLT 167  MCV 95.1  MCH 33.2  MCHC 34.9  RDW 12.1  LYMPHSABS 1.4  MONOABS 0.8  EOSABS 0.0  BASOSABS 0.1   ------------------------------------------------------------------------------------------------------------------  Results for orders placed or performed during the hospital encounter of 08/30/18 (from the past 48 hour(s))  CBC with Differential/Platelet     Status: Abnormal   Collection Time: 08/30/18 11:24 PM  Result Value Ref Range   WBC 15.1 (H) 4.0 - 10.5 K/uL   RBC 4.88 4.22 - 5.81 MIL/uL   Hemoglobin 16.2 13.0 - 17.0 g/dL   HCT 75.6 43.3 - 29.5 %   MCV 95.1 80.0 - 100.0 fL   MCH 33.2 26.0 - 34.0 pg   MCHC 34.9 30.0 - 36.0 g/dL   RDW 18.8 41.6 - 60.6 %   Platelets 167 150 - 400 K/uL   nRBC 0.0 0.0 - 0.2 %   Neutrophils Relative % 84 %   Neutro Abs 12.8 (H) 1.7 - 7.7 K/uL   Lymphocytes Relative 9 %   Lymphs Abs 1.4 0.7 - 4.0 K/uL   Monocytes Relative 5 %   Monocytes Absolute 0.8 0.1 - 1.0 K/uL   Eosinophils Relative 0 %   Eosinophils Absolute 0.0 0.0 - 0.5 K/uL   Basophils Relative 1 %   Basophils Absolute 0.1 0.0 - 0.1 K/uL   Immature Granulocytes 1 %   Abs Immature Granulocytes 0.11 (H) 0.00 - 0.07 K/uL    Comment: Performed at [facility], [address] [place], [place] [address]  Basic metabolic panel     Status: Abnormal   Collection Time: 08/30/18 11:24 PM  Result Value Ref Range   Sodium 135 135 - 145 mmol/L   Potassium 3.7 3.5 - 5.1 mmol/L   Chloride  100 98 - 111 mmol/L   CO2  25 22 - 32 mmol/L   Glucose, Bld 195 (H) 70 - 99 mg/dL   BUN 13 6 - 20 mg/dL   Creatinine, Ser 0.93 0.61 - 1.24 mg/dL   Calcium 9.0 8.9 - 81.8 mg/dL   GFR calc non Af Amer >60 >60 mL/min   GFR calc Af Amer >60 >60 mL/min   Anion gap 10 5 - 15    Comment: Performed at [facility], [address], [place], [place] [address]    Chemistries  Recent Labs  Lab 08/30/18 2324  NA 135  K 3.7  CL 100  CO2 25  GLUCOSE 195*  BUN 13  CREATININE 1.03  CALCIUM 9.0   ------------------------------------------------------------------------------------------------------------------  ------------------------------------------------------------------------------------------------------------------ GFR: Estimated Creatinine Clearance: 101.9 mL/min (by C-G formula based on SCr of 1.03 mg/dL). Liver Function Tests: No results for input(s): AST, ALT, ALKPHOS, BILITOT, PROT, ALBUMIN in the last 168 hours. No results for input(s): LIPASE, AMYLASE in the last 168 hours. No results for input(s): AMMONIA in the last 168 hours. Coagulation Profile: No results for input(s): INR, PROTIME in the last 168 hours. Cardiac Enzymes: No results for input(s): CKTOTAL, CKMB, CKMBINDEX, TROPONINI in the last 168 hours. BNP (last 3 results) No results for input(s): PROBNP in the last 8760 hours. HbA1C: No results for input(s): HGBA1C in the last 72 hours. CBG: No results for input(s): GLUCAP in the last 168 hours. Lipid Profile: No results for input(s): CHOL, HDL, LDLCALC, TRIG, CHOLHDL, LDLDIRECT in the last 72 hours. Thyroid Function Tests: No results for input(s): TSH, T4TOTAL, FREET4, T3FREE, THYROIDAB in the last 72 hours. Anemia Panel: No results for input(s): VITAMINB12, FOLATE, FERRITIN, TIBC, IRON, RETICCTPCT in the last 72 hours.  --------------------------------------------------------------------------------------------------------------- Urine analysis: No results  found for: COLORURINE, APPEARANCEUR, LABSPEC, PHURINE, GLUCOSEU, HGBUR, BILIRUBINUR, KETONESUR, PROTEINUR, UROBILINOGEN, NITRITE, LEUKOCYTESUR      Assessment & Plan:    Active Problems:   Cat scratch fever   1. Cat scratch disease-Patient has lymphangitis extending up to the right arm,he  was given Zithromax 500 mg p.o. x1, will continue with Zithromax 250 mg p.o. daily for 4 more days.   2. Right fourth finger cellulitis/abscess-started on vancomycin and Zosyn.  We will continue with antibiotics.  If no improvement patient might need I&D per surgery.  3. History of atrial fibrillation-continue Coreg, Xarelto for anticoagulation.  4. History of CAD-stable, continue Coreg  5. Hypertension-blood pressure stable, continue Coreg, lisinopril.    DVT Prophylaxis-   Lovenox   AM Labs Ordered, also please review Full Orders  Family Communication: Admission, patients condition and plan of care including tests being ordered have been discussed with the patient and his wife at bedside who indicate understanding and agree with the plan and Code Status.  Code Status: Full code  Admission status: Observation: Based on patients clinical presentation and evaluation of above clinical data, I have made determination that patient will need less than 2 midnight stay in the hospital.  Time spent in minutes : 60 minutes   Meredeth Ide M.D on 08/31/2018 at 3:13 AM

## 2018-09-01 LAB — CBC
HCT: 42.2 % (ref 39.0–52.0)
Hemoglobin: 14.3 g/dL (ref 13.0–17.0)
MCH: 33.1 pg (ref 26.0–34.0)
MCHC: 33.9 g/dL (ref 30.0–36.0)
MCV: 97.7 fL (ref 80.0–100.0)
Platelets: 137 10*3/uL — ABNORMAL LOW (ref 150–400)
RBC: 4.32 MIL/uL (ref 4.22–5.81)
RDW: 12.5 % (ref 11.5–15.5)
WBC: 12.5 10*3/uL — ABNORMAL HIGH (ref 4.0–10.5)
nRBC: 0 % (ref 0.0–0.2)

## 2018-09-01 LAB — HIV ANTIBODY (ROUTINE TESTING W REFLEX): HIV SCREEN 4TH GENERATION: NONREACTIVE

## 2018-09-01 LAB — MRSA PCR SCREENING: MRSA by PCR: NEGATIVE

## 2018-09-01 NOTE — Progress Notes (Signed)
Patient Demographics:    William Hill, is a 53 y.o. male, DOB - 08/31/1965, YSH:[id number]  Admit date - 08/30/2018   Admitting Physician Meredeth Ide, MD  Outpatient Primary MD for the patient is [facility]  LOS - 1   Chief Complaint  Patient presents with  . Wound Check    cat scratches        Subjective:    William Hill today has no fevers, no emesis,  No chest pain, discomfort/pain of right fourth finger and left forearm and wrist appears to be improving slowly  Assessment  & Plan :    Active Problems:   Cat scratch fever   1)CSD-----cat scratch disease of the right upper extremity mostly with lymphadenopathy and lymphangitis, overall erythema is improving slightly, lymphedema lymphangitis and erythema appears to be improving slightly, tenderness is improving , WBC is down to 12.5 from 18.2, continue iv azithromycin  2) Rt fourth finger cellulitis/abscess----on admission patient was started on Vanco and Zosyn, MRSA PCR screen is negative, WBC is down to 12.5 from 18.2 K , okay to stop IV Vanco, continue Zosyn  3)Chronic A. Fib----continue Coreg for rate control and Xarelto for anticoagulation  4)HTN--table, continue benazepril and Coreg for BP control  5)H/o CAD--- no chest pains or ACS symptoms at this time, continue aspirin, Lipitor niacin and Coreg  Disposition/Need for in-Hospital Stay- patient unable to be discharged at this time due to cat scratch disease with significant lymphedema, lymphangitis and secondary cellulitis requiring IV antibiotics,  Code Status : Full  Family Communication:   na   Disposition Plan  : Home  DVT Prophylaxis  : Xarelto  Lab Results  Component Value Date   PLT 137 (L) 09/01/2018    Inpatient Medications  Scheduled Meds: . aspirin EC  81 mg Oral Daily  . atorvastatin  40 mg Oral QHS  . azithromycin  250 mg Oral Daily    . benazepril  20 mg Oral Daily  . carvedilol  25 mg Oral BID  . multivitamin with minerals  1 tablet Oral Daily  . niacin  500 mg Oral Q breakfast  . omega-3 acid ethyl esters  1 g Oral BID  . rivaroxaban  20 mg Oral Q supper   Continuous Infusions: . sodium chloride 50 mL/hr at 09/01/18 1246  . piperacillin-tazobactam (ZOSYN)  IV 3.375 g (09/01/18 0921)   PRN Meds:.acetaminophen **OR** acetaminophen    Anti-infectives (From admission, onward)   Start     Dose/Rate Route Frequency Ordered Stop   08/31/18 1300  vancomycin (VANCOCIN) 1,500 mg in sodium chloride 0.9 % 500 mL IVPB  Status:  Discontinued     1,500 mg 250 mL/hr over 120 Minutes Intravenous Every 12 hours 08/31/18 0044 09/01/18 1240   08/31/18 1000  azithromycin (ZITHROMAX) tablet 250 mg     250 mg Oral Daily 08/31/18 0603 09/04/18 0959   08/31/18 0800  piperacillin-tazobactam (ZOSYN) IVPB 3.375 g     3.375 g 12.5 mL/hr over 240 Minutes Intravenous Every 8 hours 08/31/18 0044     08/31/18 0000  vancomycin (VANCOCIN) IVPB 1000 mg/200 mL premix     1,000 mg 200 mL/hr over 60 Minutes Intravenous  Once 08/30/18 2349 08/31/18 0243  08/31/18 0000  vancomycin (VANCOCIN) IVPB 1000 mg/200 mL premix     1,000 mg 200 mL/hr over 60 Minutes Intravenous  Once 08/30/18 2349 08/31/18 0244   08/30/18 2345  piperacillin-tazobactam (ZOSYN) IVPB 3.375 g     3.375 g 100 mL/hr over 30 Minutes Intravenous  Once 08/30/18 2336 08/31/18 0033   08/30/18 2330  vancomycin (VANCOCIN) IVPB 1000 mg/200 mL premix  Status:  Discontinued     1,000 mg 200 mL/hr over 60 Minutes Intravenous  Once 08/30/18 2327 08/30/18 2349   08/30/18 2315  azithromycin (ZITHROMAX) tablet 500 mg     500 mg Oral  Once 08/30/18 2313 08/30/18 2319   08/30/18 2315  doxycycline (VIBRA-TABS) tablet 100 mg     100 mg Oral  Once 08/30/18 2313 08/30/18 2319        Objective:   Vitals:   09/01/18 0000 09/01/18 0500 09/01/18 0730 09/01/18 0930  BP:  110/74  107/68   Pulse:  81    Resp:  20    Temp: 99.6 F (37.6 C) 98.3 F (36.8 C) 98.3 F (36.8 C)   TempSrc: Oral Oral Oral   SpO2:  99%    Weight:  105 kg    Height:        Wt Readings from Last 3 Encounters:  09/01/18 105 kg  01/26/18 103.5 kg  01/12/18 102.7 kg     Intake/Output Summary (Last 24 hours) at 09/01/2018 1624 Last data filed at 09/01/2018 0700 Gross per 24 hour  Intake 480 ml  Output 250 ml  Net 230 ml     Physical Exam Patient is examined daily including today on 09/01/18 , exams remain the same as of yesterday except that has changed   Gen:- Awake Alert, obese, in no acute distress HEENT:- [place].AT, No sclera icterus Neck-Supple Neck,No JVD,.  Lungs-  CTAB , fair symmetrical air movement CV- S1, S2 normal, regular  Abd-  +ve B.Sounds, Abd Soft, No tenderness,    Psych-affect is appropriate, oriented x3 Neuro-no new focal deficits, no tremors Extremity/Skin:-Improving lymphedema, lymphangitis, and improving erythema of right fourth finger and wrist as well as left wrist and forearm area--- please see photos in epic Media Information   Document Information   Photos    09/01/2018 11:46  Attached To:  Hospital Encounter on 08/30/18  Source Information   Shon HaleEmokpae, Philamena Kramar, MD  [facility]   Media Information   Document Information   Photos    09/01/2018 11:45  Attached To:  Hospital Encounter on 08/30/18  Source Information   Shon HaleEmokpae, Shaughn Thomley, MD  [facility]    Media Information   Document Information   Photos    09/01/2018 11:44  Attached To:  Hospital Encounter on 08/30/18  Source Information   Shon HaleEmokpae, Jerrik Housholder, MD  [facility]     Data Review:   Micro Results Recent Results (from the past 240 hour(s))  MRSA PCR Screening     Status: None   Collection Time: 08/31/18  1:35 PM  Result Value Ref Range Status   MRSA by PCR NEGATIVE NEGATIVE Final    Comment:        The GeneXpert MRSA Assay (FDA approved for NASAL  specimens only), is one component of a comprehensive MRSA colonization surveillance program. It is not intended to diagnose MRSA infection nor to guide or monitor treatment for MRSA infections. Performed at [facility], [address] [place], [place] [address]     Radiology Reports No results found.  CBC Recent Labs  Lab 08/30/18 2324 08/31/18 0611 09/01/18 0925  WBC 15.1* 18.2* 12.5*  HGB 16.2 17.3* 14.3  HCT 46.4 48.6 42.2  PLT 167 168 137*  MCV 95.1 96.8 97.7  MCH 33.2 34.5* 33.1  MCHC 34.9 35.6 33.9  RDW 12.1 12.2 12.5  LYMPHSABS 1.4  --   --   MONOABS 0.8  --   --   EOSABS 0.0  --   --   BASOSABS 0.1  --   --     Chemistries  Recent Labs  Lab 08/30/18 2324 08/31/18 0611  NA 135 135  K 3.7 3.9  CL 100 102  CO2 25 23  GLUCOSE 195* 196*  BUN 13 13  CREATININE 1.03 1.11  CALCIUM 9.0 9.2  AST  --  36  ALT  --  59*  ALKPHOS  --  52  BILITOT  --  1.1   ------------------------------------------------------------------------------------------------------------------ No results for input(s): CHOL, HDL, LDLCALC, TRIG, CHOLHDL, LDLDIRECT in the last 72 hours.  No results found for: HGBA1C ------------------------------------------------------------------------------------------------------------------ No results for input(s): TSH, T4TOTAL, T3FREE, THYROIDAB in the last 72 hours.  Invalid input(s): FREET3 ------------------------------------------------------------------------------------------------------------------ No results for input(s): VITAMINB12, FOLATE, FERRITIN, TIBC, IRON, RETICCTPCT in the last 72 hours.  Coagulation profile No results for input(s): INR, PROTIME in the last 168 hours.  No results for input(s): DDIMER in the last 72 hours.  Cardiac Enzymes No results for input(s): CKMB, TROPONINI, MYOGLOBIN in the last 168 hours.  Invalid input(s):  CK ------------------------------------------------------------------------------------------------------------------ No results found for: BNP   Shon Hale M.D on 09/01/2018 at 4:24 PM  Go to [email or web address] - for contact info  [facility] - Office  [phone]

## 2018-09-02 LAB — BASIC METABOLIC PANEL
Anion gap: 6 (ref 5–15)
BUN: 13 mg/dL (ref 6–20)
CALCIUM: 8.6 mg/dL — AB (ref 8.9–10.3)
CO2: 24 mmol/L (ref 22–32)
Chloride: 109 mmol/L (ref 98–111)
Creatinine, Ser: 1.47 mg/dL — ABNORMAL HIGH (ref 0.61–1.24)
GFR calc Af Amer: >60 mL/min (ref >60)
GFR, EST NON AFRICAN AMERICAN: 54 mL/min — AB (ref >60)
Glucose, Bld: 169 mg/dL — ABNORMAL HIGH (ref 70–99)
Potassium: 4.2 mmol/L (ref 3.5–5.1)
Sodium: 139 mmol/L (ref 135–145)

## 2018-09-02 LAB — CBC
HCT: 42 % (ref 39.0–52.0)
Hemoglobin: 14.3 g/dL (ref 13.0–17.0)
MCH: 34.1 pg — AB (ref 26.0–34.0)
MCHC: 34 g/dL (ref 30.0–36.0)
MCV: 100.2 fL — ABNORMAL HIGH (ref 80.0–100.0)
Platelets: 146 10*3/uL — ABNORMAL LOW (ref 150–400)
RBC: 4.19 MIL/uL — ABNORMAL LOW (ref 4.22–5.81)
RDW: 12.6 % (ref 11.5–15.5)
WBC: 10.9 10*3/uL — ABNORMAL HIGH (ref 4.0–10.5)
nRBC: 0 % (ref 0.0–0.2)

## 2018-09-02 NOTE — Progress Notes (Signed)
Patient Demographics:    William Hill, is a 53 y.o. male, DOB - Jul 03, 1965, QHK:[id number]  Admit date - 08/30/2018   Admitting Physician Meredeth Ide, MD  Outpatient Primary MD for the patient is [facility]  LOS - 2   Chief Complaint  Patient presents with  . Wound Check    cat scratches        Subjective:    William Hill today has no fevers, no emesis,  No chest pain, discomfort/pain of right fourth finger and left forearm and wrist appears to be improving slowly  Assessment  & Plan :    Active Problems:   Cat scratch fever   1)CSD-----cat scratch disease of BOTH upper extremity mostly with lymphadenopathy and lymphangitis, overall erythema is improving slightly, lymphedema lymphangitis and erythema appears to be improving slightly, tenderness is improving , WBC is down to 10.9 from 18.2, continue iv azithromycin  2) Rt fourth finger cellulitis/abscess----on admission patient was started on Vanco and Zosyn, MRSA PCR screen is negative, WBC is down to 10.9  from 18.2 K ,   stopped IV Vanco, continue Zosyn  3)Chronic A. Fib----continue Coreg for rate control and Xarelto for anticoagulation  4)HTN--stable, Hold benazepril due to AKI, but c/n Coreg for BP control  5)H/o CAD--- no chest pains or ACS symptoms at this time, continue aspirin, Lipitor niacin and Coreg  6)AKI----acute kidney injury  , suspect due to vanco/zosyn combo,  creatinine on admission=1.03  ,   baseline creatinine = 0.9    , creatinine is now= 1.47 , renally adjust medications, avoid nephrotoxic agents/dehydration/hypotension   Disposition/Need for in-Hospital Stay- patient unable to be discharged at this time due to cat scratch disease with significant lymphedema, lymphangitis and secondary cellulitis requiring IV antibiotics, also patient is now developing AKI with creatinine up to almost 1.5 from a  baseline of 0.9, patient requires IV fluids  Code Status : Full  Family Communication:   na   Disposition Plan  : Home  DVT Prophylaxis  : Xarelto  Lab Results  Component Value Date   PLT 146 (L) 09/02/2018    Inpatient Medications  Scheduled Meds: . aspirin EC  81 mg Oral Daily  . atorvastatin  40 mg Oral QHS  . azithromycin  250 mg Oral Daily  . benazepril  20 mg Oral Daily  . carvedilol  25 mg Oral BID  . multivitamin with minerals  1 tablet Oral Daily  . niacin  500 mg Oral Q breakfast  . omega-3 acid ethyl esters  1 g Oral BID  . rivaroxaban  20 mg Oral Q supper   Continuous Infusions: . sodium chloride 100 mL/hr at 09/02/18 0950  . piperacillin-tazobactam (ZOSYN)  IV 3.375 g (09/02/18 1627)   PRN Meds:.acetaminophen **OR** acetaminophen    Anti-infectives (From admission, onward)   Start     Dose/Rate Route Frequency Ordered Stop   08/31/18 1300  vancomycin (VANCOCIN) 1,500 mg in sodium chloride 0.9 % 500 mL IVPB  Status:  Discontinued     1,500 mg 250 mL/hr over 120 Minutes Intravenous Every 12 hours 08/31/18 0044 09/01/18 1240   08/31/18 1000  azithromycin (ZITHROMAX) tablet 250 mg     250 mg Oral Daily 08/31/18 0603  09/04/18 0959   08/31/18 0800  piperacillin-tazobactam (ZOSYN) IVPB 3.375 g     3.375 g 12.5 mL/hr over 240 Minutes Intravenous Every 8 hours 08/31/18 0044     08/31/18 0000  vancomycin (VANCOCIN) IVPB 1000 mg/200 mL premix     1,000 mg 200 mL/hr over 60 Minutes Intravenous  Once 08/30/18 2349 08/31/18 0243   08/31/18 0000  vancomycin (VANCOCIN) IVPB 1000 mg/200 mL premix     1,000 mg 200 mL/hr over 60 Minutes Intravenous  Once 08/30/18 2349 08/31/18 0244   08/30/18 2345  piperacillin-tazobactam (ZOSYN) IVPB 3.375 g     3.375 g 100 mL/hr over 30 Minutes Intravenous  Once 08/30/18 2336 08/31/18 0033   08/30/18 2330  vancomycin (VANCOCIN) IVPB 1000 mg/200 mL premix  Status:  Discontinued     1,000 mg 200 mL/hr over 60 Minutes Intravenous   Once 08/30/18 2327 08/30/18 2349   08/30/18 2315  azithromycin (ZITHROMAX) tablet 500 mg     500 mg Oral  Once 08/30/18 2313 08/30/18 2319   08/30/18 2315  doxycycline (VIBRA-TABS) tablet 100 mg     100 mg Oral  Once 08/30/18 2313 08/30/18 2319        Objective:   Vitals:   09/02/18 0011 09/02/18 0418 09/02/18 0800 09/02/18 1429  BP:    123/77  Pulse:    81  Resp:    18  Temp: 98.5 F (36.9 C) 98 F (36.7 C) 98.6 F (37 C) 98.4 F (36.9 C)  TempSrc: Oral Oral Oral Oral  SpO2: 100%   100%  Weight:      Height:        Wt Readings from Last 3 Encounters:  09/01/18 105 kg  01/26/18 103.5 kg  01/12/18 102.7 kg     Intake/Output Summary (Last 24 hours) at 09/02/2018 1800 Last data filed at 09/02/2018 1432 Gross per 24 hour  Intake 2020.19 ml  Output 375 ml  Net 1645.19 ml     Physical Exam Patient is examined daily including today on 09/02/18 , exams remain the same as of yesterday except that has changed   Gen:- Awake Alert, obese, in no acute distress HEENT:- [place].AT, No sclera icterus Neck-Supple Neck,No JVD,.  Lungs-  CTAB , fair symmetrical air movement CV- S1, S2 normal, regular  Abd-  +ve B.Sounds, Abd Soft, No tenderness,    Psych-affect is appropriate, oriented x3 Neuro-no new focal deficits, no tremors Extremity/Skin:-Improving lymphedema, lymphangitis, and improving erythema of right fourth finger and wrist as well as left wrist and forearm area--- please see photos in epic Media Information   Document Information   Photos    09/01/2018 11:46  Attached To:  Hospital Encounter on 08/30/18  Source Information   Shon HaleEmokpae, Alika Eppes, MD  [facility]   Media Information   Document Information   Photos    09/01/2018 11:45  Attached To:  Hospital Encounter on 08/30/18  Source Information   Shon HaleEmokpae, Malene Blaydes, MD  [facility]    Media Information   Document Information   Photos    09/01/2018 11:44  Attached To:  Hospital Encounter on  08/30/18  Source Information   Shon HaleEmokpae, Madilyne Tadlock, MD  [facility]     Data Review:   Micro Results Recent Results (from the past 240 hour(s))  MRSA PCR Screening     Status: None   Collection Time: 08/31/18  1:35 PM  Result Value Ref Range Status   MRSA by PCR NEGATIVE NEGATIVE Final    Comment:  The GeneXpert MRSA Assay (FDA approved for NASAL specimens only), is one component of a comprehensive MRSA colonization surveillance program. It is not intended to diagnose MRSA infection nor to guide or monitor treatment for MRSA infections. Performed at [facility], [address], [place], [place] [address]     Radiology Reports No results found.   CBC Recent Labs  Lab 08/30/18 2324 08/31/18 0611 09/01/18 0925 09/02/18 0622  WBC 15.1* 18.2* 12.5* 10.9*  HGB 16.2 17.3* 14.3 14.3  HCT 46.4 48.6 42.2 42.0  PLT 167 168 137* 146*  MCV 95.1 96.8 97.7 100.2*  MCH 33.2 34.5* 33.1 34.1*  MCHC 34.9 35.6 33.9 34.0  RDW 12.1 12.2 12.5 12.6  LYMPHSABS 1.4  --   --   --   MONOABS 0.8  --   --   --   EOSABS 0.0  --   --   --   BASOSABS 0.1  --   --   --     Chemistries  Recent Labs  Lab 08/30/18 2324 08/31/18 0611 09/02/18 0622  NA 135 135 139  K 3.7 3.9 4.2  CL 100 102 109  CO2 25 23 24   GLUCOSE 195* 196* 169*  BUN 13 13 13   CREATININE 1.03 1.11 1.47*  CALCIUM 9.0 9.2 8.6*  AST  --  36  --   ALT  --  59*  --   ALKPHOS  --  52  --   BILITOT  --  1.1  --    ------------------------------------------------------------------------------------------------------------------ No results for input(s): CHOL, HDL, LDLCALC, TRIG, CHOLHDL, LDLDIRECT in the last 72 hours.  No results found for: HGBA1C ------------------------------------------------------------------------------------------------------------------ No results for input(s): TSH, T4TOTAL, T3FREE, THYROIDAB in the last 72 hours.  Invalid input(s):  FREET3 ------------------------------------------------------------------------------------------------------------------ No results for input(s): VITAMINB12, FOLATE, FERRITIN, TIBC, IRON, RETICCTPCT in the last 72 hours.  Coagulation profile No results for input(s): INR, PROTIME in the last 168 hours.  No results for input(s): DDIMER in the last 72 hours.  Cardiac Enzymes No results for input(s): CKMB, TROPONINI, MYOGLOBIN in the last 168 hours.  Invalid input(s): CK ------------------------------------------------------------------------------------------------------------------ No results found for: BNP   Shon Hale M.D on 09/02/2018 at 6:00 PM  Go to [email or web address] - for contact info  [facility] - Office  [phone]

## 2018-09-03 LAB — CBC
HCT: 41.1 % (ref 39.0–52.0)
Hemoglobin: 13.7 g/dL (ref 13.0–17.0)
MCH: 32.9 pg (ref 26.0–34.0)
MCHC: 33.3 g/dL (ref 30.0–36.0)
MCV: 98.8 fL (ref 80.0–100.0)
PLATELETS: 149 10*3/uL — AB (ref 150–400)
RBC: 4.16 MIL/uL — ABNORMAL LOW (ref 4.22–5.81)
RDW: 12.4 % (ref 11.5–15.5)
WBC: 6.6 10*3/uL (ref 4.0–10.5)
nRBC: 0 % (ref 0.0–0.2)

## 2018-09-03 LAB — BASIC METABOLIC PANEL
Anion gap: 9 (ref 5–15)
BUN: 11 mg/dL (ref 6–20)
CO2: 21 mmol/L — ABNORMAL LOW (ref 22–32)
Calcium: 8.7 mg/dL — ABNORMAL LOW (ref 8.9–10.3)
Chloride: 109 mmol/L (ref 98–111)
Creatinine, Ser: 1.2 mg/dL (ref 0.61–1.24)
GFR calc non Af Amer: >60 mL/min (ref >60)
Glucose, Bld: 165 mg/dL — ABNORMAL HIGH (ref 70–99)
Potassium: 3.9 mmol/L (ref 3.5–5.1)
Sodium: 139 mmol/L (ref 135–145)

## 2018-09-03 MED ORDER — AZITHROMYCIN 500 MG PO TABS: 500.0000 mg | ORAL_TABLET | Freq: Every day | ORAL | 0 refills | Status: AC

## 2018-09-03 MED ORDER — AMOXICILLIN-POT CLAVULANATE 875-125 MG PO TABS: 1.0000 | ORAL_TABLET | Freq: Two times a day (BID) | ORAL | 0 refills | Status: AC

## 2018-09-03 MED ORDER — ACETAMINOPHEN 325 MG PO TABS: 650.0000 mg | ORAL_TABLET | Freq: Four times a day (QID) | ORAL | 0 refills | Status: AC | PRN

## 2018-09-03 MED ORDER — ASPIRIN 81 MG PO TABS: 81.0000 mg | ORAL_TABLET | Freq: Every day | ORAL | 0 refills | Status: DC

## 2018-09-03 NOTE — Discharge Summary (Signed)
William Hill, is a 53 y.o. male  DOB [birth year]  MRN [id number].  Admission date:  08/30/2018  Admitting Physician  Meredeth Ide, MD  Discharge Date:  09/03/2018   Primary MD  Pllc, [facility]  Recommendations for primary care physician for things to follow:   1) take medications including antibiotics as prescribed 2) avoid scratching affected areas,  3) while taking showers avoid scrubbing affected areas to her to avoid breaking the skin 4) avoid baths 5) follow-up  with primary care physician within a week for recheck and reevaluation 6) you are taking Xarelto for blood thinner so Avoid ibuprofen/Advil/Aleve/Motrin/Goody Powders/Naproxen/BC powders/Meloxicam/Diclofenac/Indomethacin and other Nonsteroidal anti-inflammatory medications as these will make you more likely to bleed and can cause stomach ulcers, can also cause Kidney problems.   Admission Diagnosis  Cat-scratch disease [A28.1] Wound infection [T14.8XXA, L08.9] Cat scratch fever [A28.1]   Discharge Diagnosis  Cat-scratch disease [A28.1] Wound infection [T14.8XXA, L08.9] Cat scratch fever [A28.1]    Active Problems:   Cat scratch fever      Past Medical History:  Diagnosis Date  . CAD (coronary artery disease)    post anterior wall myocardial infarction in 1999 with PCI and stenting at [facility].  . Heart attack (HCC)   . History of stress test 12/2010   EF of 36% with apical scar  . Hx of echocardiogram 02/13/2012   EF 35%. There is apical dyskinesis  . Hx of echocardiogram 05/09/2012   difinity contrast that showed no apical mural thrombus with an EF of 30%-35%  . Hyperlipidemia   . Hypertension     Past Surgical History:  Procedure Laterality Date  . CORONARY ANGIOPLASTY       HPI  from the history and physical done on the day of admission:      William Hill  is a 53 y.o. male, with history of  CAD, hypertension, hyperlipidemia, atrial fibrillation came to hospital with chief complaint of cat scratches to right forearm as well as left fourth finger.  Denies any bites.  Patient says that the redness started spreading up right forearm with streaking erythema.  Also had draining wound on left fourth finger.  Had temperature of 103.9 at home.  Also admits to feeling cold.  Denies dysuria. Denies chest pain or shortness of breath.  Patient says that he clean the wound with alcohol and has applied topical antibiotic. In the ED patient was started on vancomycin, Zosyn, Zithromax No previous history of stroke or seizures. He is on anticoagulation with Xarelto for atrial fibrillation and CAD.    Hospital Course:   1)CSD-----cat scratch disease of BOTH upper extremity mostly with lymphadenopathy and lymphangitis, overall erythema continues to improve, lymphedema lymphangitis and erythema also continues to improve,, tenderness has improved, WBC is down to 6.6 from 18.2, treated with iv azithromycin, discharged on oral azithromycin  2) Rtfourth finger cellulitis/abscess----on admission patient was started on Vanco and Zosyn, MRSA PCRscreen is negative,WBC is down to 6.6 from 18.2K ,   stopped  IV Vanco, treated with Zosyn, okay to discharge on Augmentin  3)Chronic A. Fib----stable, continue Coreg for rate control and Xarelto for anticoagulation  4)HTN--stable,  okay to restart benazepril and  Coreg for BP control  5)H/o CAD---no chest pains or ACS symptoms at this time,continue aspirin, Lipitor niacin and Coreg  6)AKI----acute kidney injury  , suspect due to vanco/zosyn combo,  overall resolving, continue to push fluids orally, creatinine on admission=1.03  ,   baseline creatinine = 0.9    , creatinine is now= 1.20 (peak 1.47) , renally adjust medications, avoid nephrotoxic agents/dehydration/hypotension   Discharge Condition: stable  Follow UP--- PCP for recheck within a  week  Diet and Activity recommendation:  As advised  Discharge Instructions    Discharge Instructions    Call MD for:   Complete by:  As directed    Call MD for:  difficulty breathing, headache or visual disturbances   Complete by:  As directed    Call MD for:  persistant dizziness or light-headedness   Complete by:  As directed    Call MD for:  persistant nausea and vomiting   Complete by:  As directed    Call MD for:  severe uncontrolled pain   Complete by:  As directed    Call MD for:  temperature >100.4   Complete by:  As directed    Diet - low sodium heart healthy   Complete by:  As directed    Discharge instructions   Complete by:  As directed    1) take medications including antibiotics as prescribed 2) avoid scratching affected areas,  3) while taking showers avoid scrubbing affected areas to her to avoid breaking the skin 4) avoid baths 5) follow-up  with primary care physician within a week for recheck and reevaluation 6) you are taking Xarelto for blood thinner so Avoid ibuprofen/Advil/Aleve/Motrin/Goody Powders/Naproxen/BC powders/Meloxicam/Diclofenac/Indomethacin and other Nonsteroidal anti-inflammatory medications as these will make you more likely to bleed and can cause stomach ulcers, can also cause Kidney problems.   Increase activity slowly   Complete by:  As directed         Discharge Medications     Allergies as of 09/03/2018   No Known Allergies     Medication List    TAKE these medications   acetaminophen 325 MG tablet Commonly known as:  TYLENOL Take 2 tablets (650 mg total) by mouth every 6 (six) hours as needed for mild pain (or Fever >/= 101).   amoxicillin-clavulanate 875-125 MG tablet Commonly known as:  Augmentin Take 1 tablet by mouth 2 (two) times daily for 7 days.   aspirin 81 MG tablet Take 1 tablet (81 mg total) by mouth daily with breakfast. What changed:  when to take this   atorvastatin 40 MG tablet Commonly known as:   LIPITOR TAKE 1 TABLET BY MOUTH AT BEDTIME   azithromycin 500 MG tablet Commonly known as:  ZITHROMAX Take 1 tablet (500 mg total) by mouth daily for 5 days.   benazepril 20 MG tablet Commonly known as:  LOTENSIN TAKE 1 TABLET BY MOUTH EVERY DAY   carvedilol 25 MG tablet Commonly known as:  COREG Take 1 tablet (25 mg total) by mouth 2 (two) times daily.   fish oil-omega-3 fatty acids 1000 MG capsule Take 1 g by mouth 2 (two) times daily.   multivitamin with minerals Tabs tablet Take 1 tablet by mouth daily.   niacin 500 MG tablet Take 500 mg by mouth daily with breakfast.  Xarelto 20 MG Tabs tablet Generic drug:  rivaroxaban TAKE ONE TABLET BY MOUTH ONCE DAILY WITH SUPPER       Major procedures and Radiology Reports - PLEASE review detailed and final reports for all details, in brief -    No results found.  Micro Results    Recent Results (from the past 240 hour(s))  MRSA PCR Screening     Status: None   Collection Time: 08/31/18  1:35 PM  Result Value Ref Range Status   MRSA by PCR NEGATIVE NEGATIVE Final    Comment:        The GeneXpert MRSA Assay (FDA approved for NASAL specimens only), is one component of a comprehensive MRSA colonization surveillance program. It is not intended to diagnose MRSA infection nor to guide or monitor treatment for MRSA infections. 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 rate 20, height 5\' 9"  (1.753 m), weight 105 kg, SpO2 100  %.   Intake/Output Summary (Last 24 hours) at 09/03/2018 1300 Last data filed at 09/03/2018 0630 Gross per 24 hour  Intake 1049.82 ml  Output 375 ml  Net 674.82 ml    Exam Gen:- Awake Alert, obese, in no acute distress HEENT:- [place].AT, No sclera icterus Neck-Supple Neck,No JVD,.  Lungs-  CTAB , fair symmetrical air movement CV- S1, S2 normal, regular  Abd-  +ve B.Sounds, Abd Soft, No tenderness,    Psych-affect is appropriate, oriented x3 Neuro-no new focal deficits, no tremors Extremity/Skin:-Improving lymphedema, lymphangitis, and improving erythema of right fourth finger and wrist as well as left wrist and forearm area--- please see original/admission photos in epic   Data Review   CBC w Diff:  Lab Results  Component Value Date   WBC 6.6 09/03/2018   HGB 13.7 09/03/2018   HGB 18.6 (H) 05/08/2017   HCT 41.1 09/03/2018   HCT 51.3 (H) 05/08/2017   PLT 149 (L) 09/03/2018   PLT 167 05/08/2017   LYMPHOPCT 9 08/30/2018   MONOPCT 5 08/30/2018   EOSPCT 0 08/30/2018   BASOPCT 1 08/30/2018    CMP:  Lab Results  Component Value Date   NA 139 09/03/2018   NA 139 05/08/2017   K 3.9 09/03/2018   CL 109 09/03/2018   CO2 21 (L) 09/03/2018   BUN 11 09/03/2018   BUN 9 05/08/2017   CREATININE 1.20 09/03/2018   CREATININE 0.95 01/19/2016   PROT 7.2 08/31/2018   PROT 7.2 01/12/2018   ALBUMIN 4.2 08/31/2018   ALBUMIN 4.6 01/12/2018   BILITOT 1.1 08/31/2018   BILITOT 1.1 01/12/2018   ALKPHOS 52 08/31/2018   AST 36 08/31/2018   ALT 59 (H) 08/31/2018  .   Total Discharge time is about 33 minutes  Shon Hale M.D on 09/03/2018 at 1:00 PM  Go to [email or web address] -  for contact info  [facility] - Office  [phone]

## 2018-09-03 NOTE — Discharge Instructions (Signed)
1) take medications including antibiotics as prescribed 2) avoid scratching affected areas,  3) while taking showers avoid scrubbing affected areas to her to avoid breaking the skin 4) avoid baths 5) follow-up  with primary care physician within a week for recheck and reevaluation 6) you are taking Xarelto for blood thinner so Avoid ibuprofen/Advil/Aleve/Motrin/Goody Powders/Naproxen/BC powders/Meloxicam/Diclofenac/Indomethacin and other Nonsteroidal anti-inflammatory medications as these will make you more likely to bleed and can cause stomach ulcers, can also cause Kidney problems.

## 2018-09-03 NOTE — Progress Notes (Signed)
Patient is to be discharged home and in stable condition. Patient given discharge instructions and verbalized understanding. All questions addressed and answered. Work note given to patient. Patient denies the need for a wheelchair escort. Requesting to ambulate.  Quita Skye, RN

## 2018-09-21 ENCOUNTER — Telehealth: Payer: Self-pay | Admitting: Cardiovascular Disease

## 2018-09-21 NOTE — Telephone Encounter (Signed)
Pt called and wanted to know if Dr. Allyson Sabal would write a letter for the Pt to be excused from work. 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

## 2018-09-21 NOTE — Telephone Encounter (Signed)
I have not seen him in the office in 8 months.  He was stable when I last saw him.  I think he probably will need a work excuse note from his PCP since there is nothing from a heart point of view that would allow me to take him out of work.

## 2018-09-21 NOTE — Telephone Encounter (Signed)
Please leave a voicemail if the pt does not answer. He is hesitant to answer unknown numbers

## 2018-09-21 NOTE — Telephone Encounter (Signed)
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  I advised pt that I will need to forward to Dr. Allyson Sabal for his review.  Pt verbalized understanding and if he writes him the letter.. pt would like to pick up in the office. May write.William Hill to whom it may concern since he says he does not have the exact information of whom to send it to.

## 2018-09-24 NOTE — Telephone Encounter (Signed)
Follow Up:     Pt calling back to find out about his note for work.

## 2018-09-24 NOTE — Telephone Encounter (Signed)
Pt updated with Dr. Berry's recommendations. 

## 2018-10-27 ENCOUNTER — Other Ambulatory Visit: Payer: Self-pay | Admitting: Cardiovascular Disease

## 2018-11-06 ENCOUNTER — Encounter (HOSPITAL_COMMUNITY): Payer: Self-pay | Admitting: Emergency Medicine

## 2018-11-06 ENCOUNTER — Emergency Department (HOSPITAL_COMMUNITY)
Admission: EM | Admit: 2018-11-06 | Discharge: 2018-11-06 | Disposition: A | Discharge status: Home / Self Care | Payer: BLUE CROSS/BLUE SHIELD | Attending: Emergency Medicine | Admitting: Emergency Medicine

## 2018-11-06 ENCOUNTER — Other Ambulatory Visit: Payer: Self-pay

## 2018-11-06 ENCOUNTER — Emergency Department (HOSPITAL_COMMUNITY): Payer: BLUE CROSS/BLUE SHIELD

## 2018-11-06 DIAGNOSIS — Z7982 Long term (current) use of aspirin: Secondary | ICD-10-CM | POA: Insufficient documentation

## 2018-11-06 DIAGNOSIS — I2510 Atherosclerotic heart disease of native coronary artery without angina pectoris: Secondary | ICD-10-CM | POA: Insufficient documentation

## 2018-11-06 DIAGNOSIS — R05 Cough: Secondary | ICD-10-CM | POA: Diagnosis not present

## 2018-11-06 DIAGNOSIS — Z7901 Long term (current) use of anticoagulants: Secondary | ICD-10-CM | POA: Insufficient documentation

## 2018-11-06 DIAGNOSIS — Z79899 Other long term (current) drug therapy: Secondary | ICD-10-CM | POA: Insufficient documentation

## 2018-11-06 DIAGNOSIS — Z20828 Contact with and (suspected) exposure to other viral communicable diseases: Secondary | ICD-10-CM | POA: Diagnosis not present

## 2018-11-06 DIAGNOSIS — I10 Essential (primary) hypertension: Secondary | ICD-10-CM | POA: Insufficient documentation

## 2018-11-06 DIAGNOSIS — R509 Fever, unspecified: Secondary | ICD-10-CM | POA: Insufficient documentation

## 2018-11-06 LAB — URINALYSIS, ROUTINE W REFLEX MICROSCOPIC
Bacteria, UA: NONE SEEN
Bilirubin Urine: NEGATIVE
Glucose, UA: 150 mg/dL — AB
Hgb urine dipstick: NEGATIVE
Ketones, ur: NEGATIVE mg/dL
Leukocytes,Ua: NEGATIVE
Nitrite: NEGATIVE
Protein, ur: 100 mg/dL — AB
Specific Gravity, Urine: 1.017 (ref 1.005–1.030)
pH: 6 (ref 5.0–8.0)

## 2018-11-06 LAB — SARS CORONAVIRUS 2 BY RT PCR (HOSPITAL ORDER, PERFORMED IN ~~LOC~~ HOSPITAL LAB): SARS Coronavirus 2: NEGATIVE

## 2018-11-06 LAB — BASIC METABOLIC PANEL
Anion gap: 12 (ref 5–15)
BUN: 11 mg/dL (ref 6–20)
CO2: 24 mmol/L (ref 22–32)
Calcium: 9 mg/dL (ref 8.9–10.3)
Chloride: 99 mmol/L (ref 98–111)
Creatinine, Ser: 1.16 mg/dL (ref 0.61–1.24)
GFR calc Af Amer: >60 mL/min (ref >60)
GFR calc non Af Amer: >60 mL/min (ref >60)
Glucose, Bld: 214 mg/dL — ABNORMAL HIGH (ref 70–99)
Potassium: 3.6 mmol/L (ref 3.5–5.1)
Sodium: 135 mmol/L (ref 135–145)

## 2018-11-06 LAB — CBC
HCT: 48.3 % (ref 39.0–52.0)
Hemoglobin: 17.3 g/dL — ABNORMAL HIGH (ref 13.0–17.0)
MCH: 34.5 pg — ABNORMAL HIGH (ref 26.0–34.0)
MCHC: 35.8 g/dL (ref 30.0–36.0)
MCV: 96.2 fL (ref 80.0–100.0)
Platelets: 129 10*3/uL — ABNORMAL LOW (ref 150–400)
RBC: 5.02 MIL/uL (ref 4.22–5.81)
RDW: 13.1 % (ref 11.5–15.5)
WBC: 11 10*3/uL — ABNORMAL HIGH (ref 4.0–10.5)
nRBC: 0 % (ref 0.0–0.2)

## 2018-11-06 NOTE — ED Triage Notes (Signed)
PT c/o fever with muscles aches with occasional productive clear sputum cough. PT was seen at [facility] this am by Dr. Phillips Odor and told to come to ED for eval. PT denies any known covid exposure.

## 2018-11-06 NOTE — ED Provider Notes (Signed)
[facility] EMERGENCY DEPARTMENT Provider Note   CSN: [id number] Arrival date & time: 11/06/18  1013    History   Chief Complaint Chief Complaint  Patient presents with  . Fever    HPI YUAN GANN is a 53 y.o. male.     HPI Patient presents to the emergency room for evaluation of fever.  Patient states yesterday after work he started feeling feverish.  He had a temperature up to 103.  Since then the highest is 100.7.  Patient's had a very slight cough but nothing particularly significant.  He has had some diffuse myalgias.  He denies any rashes.  No dysuria.  No vomiting or diarrhea.  No headache or significant nasal congestion.  No sore throat.  Patient has been going to work but does not know of any specific ill contacts.  He went to his primary care doctor's office today who sent him to the ED. Past Medical History:  Diagnosis Date  . CAD (coronary artery disease)    post anterior wall myocardial infarction in 1999 with PCI and stenting at [facility].  . Heart attack (HCC)   . History of stress test 12/2010   EF of 36% with apical scar  . Hx of echocardiogram 02/13/2012   EF 35%. There is apical dyskinesis  . Hx of echocardiogram 05/09/2012   difinity contrast that showed no apical mural thrombus with an EF of 30%-35%  . Hyperlipidemia   . Hypertension     Patient Active Problem List   Diagnosis Date Noted  . Cat scratch fever 08/31/2018  . Atrial fibrillation (HCC) 01/19/2016  . Essential hypertension 02/11/2013  . Hyperlipidemia 02/11/2013  . Cardiomyopathy, ischemic 02/11/2013    Past Surgical History:  Procedure Laterality Date  . BACK SURGERY    . CORONARY ANGIOPLASTY          Home Medications    Prior to Admission medications   Medication Sig Start Date End Date Taking? Authorizing Provider  acetaminophen (TYLENOL) 325 MG tablet Take 2 tablets (650 mg total) by mouth every 6 (six) hours as needed for mild pain (or Fever >/= 101). 09/03/18    Emokpae, Courage, MD  aspirin 81 MG tablet Take 1 tablet (81 mg total) by mouth daily with breakfast. 09/03/18   Mariea Clonts, Courage, MD  atorvastatin (LIPITOR) 40 MG tablet TAKE 1 TABLET BY MOUTH AT BEDTIME 02/12/18   Runell Gess, MD  benazepril (LOTENSIN) 20 MG tablet TAKE 1 TABLET BY MOUTH EVERY DAY 02/12/18   Runell Gess, MD  carvedilol (COREG) 25 MG tablet Take 1 tablet (25 mg total) by mouth 2 (two) times daily. 11/27/17 08/31/18  Runell Gess, MD  fish oil-omega-3 fatty acids 1000 MG capsule Take 1 g by mouth 2 (two) times daily.    [provider]  Multiple Vitamin (MULTIVITAMIN WITH MINERALS) TABS tablet Take 1 tablet by mouth daily.    [provider]  niacin 500 MG tablet Take 500 mg by mouth daily with breakfast.    [provider]  XARELTO 20 MG TABS tablet TAKE ONE TABLET BY MOUTH ONCE DAILY WITH SUPPER 10/29/18   Runell Gess, MD    Family History Family History  Problem Relation Age of Onset  . Cancer Father   . Heart Problems Maternal Grandmother   . Stroke Maternal Grandfather   . Heart attack Paternal Grandfather     Social History Social History   Tobacco Use  . Smoking status: Never Smoker  .  Smokeless tobacco: Never Used  Substance Use Topics  . Alcohol use: No  . Drug use: Never     Allergies   Patient has no known allergies.   Review of Systems Review of Systems  All other systems reviewed and are negative.    Physical Exam Updated Vital Signs BP 114/64   Pulse (!) 106   Temp 98.8 F (37.1 C) (Oral)   Resp 20   Ht 1.765 m (5' 9.5")   Wt 106.6 kg   SpO2 99%   BMI 34.21 kg/m   Physical Exam Vitals signs and nursing note reviewed.  Constitutional:      General: He is not in acute distress.    Appearance: He is well-developed.  HENT:     Head: Normocephalic and atraumatic.     Right Ear: External ear normal.     Left Ear: External ear normal.  Eyes:     General: No scleral icterus.       Right  eye: No discharge.        Left eye: No discharge.     Conjunctiva/sclera: Conjunctivae normal.  Neck:     Musculoskeletal: Neck supple.     Trachea: No tracheal deviation.  Cardiovascular:     Rate and Rhythm: Normal rate and regular rhythm.  Pulmonary:     Effort: Pulmonary effort is normal. No respiratory distress.     Breath sounds: Normal breath sounds. No stridor. No wheezing or rales.  Abdominal:     General: Bowel sounds are normal. There is no distension.     Palpations: Abdomen is soft.     Tenderness: There is no abdominal tenderness. There is no guarding or rebound.  Musculoskeletal:        General: No tenderness.  Skin:    General: Skin is warm and dry.     Findings: No rash.     Comments: Hyperpigmentation noted from previous area of skin infection but no active erythema or induration  Neurological:     Mental Status: He is alert.     Cranial Nerves: No cranial nerve deficit (no facial droop, extraocular movements intact, no slurred speech).     Sensory: No sensory deficit.     Motor: No abnormal muscle tone or seizure activity.     Coordination: Coordination normal.      ED Treatments / Results  Labs (all labs ordered are listed, but only abnormal results are displayed) Labs Reviewed  CBC - Abnormal; Notable for the following components:      Result Value   WBC 11.0 (*)    Hemoglobin 17.3 (*)    MCH 34.5 (*)    Platelets 129 (*)    All other components within normal limits  BASIC METABOLIC PANEL - Abnormal; Notable for the following components:   Glucose, Bld 214 (*)    All other components within normal limits  URINALYSIS, ROUTINE W REFLEX MICROSCOPIC - Abnormal; Notable for the following components:   Glucose, UA 150 (*)    Protein, ur 100 (*)    All other components within normal limits  SARS CORONAVIRUS 2 (HOSPITAL ORDER, PERFORMED IN [facility] LAB)    EKG None  Radiology Dg Chest Portable 1 View  Result Date: 11/06/2018 CLINICAL  DATA:  Fever and muscle aches.  Cough. EXAM: PORTABLE CHEST 1 VIEW COMPARISON:  None. FINDINGS: The heart size and mediastinal contours are within normal limits. Both lungs are clear. Incidental note is made of an azygos fissure. The  visualized skeletal structures are unremarkable. IMPRESSION: No active disease. Electronically Signed   By: Obie DredgeWilliam T Derry M.D.   On: 11/06/2018 11:03    Procedures Procedures (including critical care time)  Medications Ordered in ED Medications - No data to display   Initial Impression / Assessment and Plan / ED Course  I have reviewed the triage vital signs and the nursing notes.  Pertinent labs & imaging results that were available during my care of the patient were reviewed by me and considered in my medical decision making (see chart for details).   Hadassah Paisichard C Faust was evaluated in Emergency Department on 11/06/2018 for the symptoms described in the history of present illness. He was evaluated in the context of the global COVID-19 pandemic, which necessitated consideration that the patient might be at risk for infection with the SARS-CoV-2 virus that causes COVID-19. Institutional protocols and algorithms that pertain to the evaluation of patients at risk for COVID-19 are in a state of rapid change based on information released by regulatory bodies including the CDC and federal and state organizations. These policies and algorithms were followed during the patient's care in the ED.  Patient's ED work-up is reassuring.  Chest x-ray without pneumonia.  Laboratory tests without significant abnormalities.  Patient is afebrile here in the ED and otherwise appears well.  Suspect viral illness.  Patient's COVID-19 test was negative for I do feel this is unlikely.  Plan on discharge home with supportive medications.  Warning signs precautions discussed. Final Clinical Impressions(s) / ED Diagnoses   Final diagnoses:  Fever, unspecified fever cause    ED Discharge  Orders    None       Linwood DibblesKnapp, Aracelia Brinson, MD 11/06/18 1223

## 2018-11-06 NOTE — Discharge Instructions (Addendum)
Test today were reassuring.  Your chest x-ray did not show pneumonia.  You were tested for COVID-19 and it was negative.  Continue to take Tylenol as needed for fever, rest, drink plenty of fluids.  Follow-up with your primary care doctor.  Return to the ED for worsening symptoms

## 2018-11-08 ENCOUNTER — Other Ambulatory Visit: Payer: Self-pay

## 2018-11-08 ENCOUNTER — Emergency Department (HOSPITAL_COMMUNITY): Payer: BLUE CROSS/BLUE SHIELD

## 2018-11-08 ENCOUNTER — Inpatient Hospital Stay (HOSPITAL_COMMUNITY)
Admission: EM | Admit: 2018-11-08 | Discharge: 2018-11-11 | DRG: 871 | Disposition: A | Discharge status: Home / Self Care | Payer: BLUE CROSS/BLUE SHIELD | Attending: Internal Medicine | Admitting: Internal Medicine

## 2018-11-08 ENCOUNTER — Encounter (HOSPITAL_COMMUNITY): Payer: Self-pay | Admitting: Emergency Medicine

## 2018-11-08 DIAGNOSIS — E1165 Type 2 diabetes mellitus with hyperglycemia: Secondary | ICD-10-CM

## 2018-11-08 DIAGNOSIS — N179 Acute kidney failure, unspecified: Secondary | ICD-10-CM | POA: Diagnosis present

## 2018-11-08 DIAGNOSIS — K529 Noninfective gastroenteritis and colitis, unspecified: Secondary | ICD-10-CM | POA: Diagnosis present

## 2018-11-08 DIAGNOSIS — I10 Essential (primary) hypertension: Secondary | ICD-10-CM | POA: Diagnosis present

## 2018-11-08 DIAGNOSIS — S51812A Laceration without foreign body of left forearm, initial encounter: Secondary | ICD-10-CM | POA: Diagnosis present

## 2018-11-08 DIAGNOSIS — I130 Hypertensive heart and chronic kidney disease with heart failure and stage 1 through stage 4 chronic kidney disease, or unspecified chronic kidney disease: Secondary | ICD-10-CM | POA: Diagnosis present

## 2018-11-08 DIAGNOSIS — E872 Acidosis, unspecified: Secondary | ICD-10-CM | POA: Diagnosis present

## 2018-11-08 DIAGNOSIS — Z886 Allergy status to analgesic agent status: Secondary | ICD-10-CM

## 2018-11-08 DIAGNOSIS — I2510 Atherosclerotic heart disease of native coronary artery without angina pectoris: Secondary | ICD-10-CM | POA: Diagnosis present

## 2018-11-08 DIAGNOSIS — I5022 Chronic systolic (congestive) heart failure: Secondary | ICD-10-CM

## 2018-11-08 DIAGNOSIS — I4891 Unspecified atrial fibrillation: Secondary | ICD-10-CM | POA: Diagnosis present

## 2018-11-08 DIAGNOSIS — E785 Hyperlipidemia, unspecified: Secondary | ICD-10-CM | POA: Diagnosis present

## 2018-11-08 DIAGNOSIS — A419 Sepsis, unspecified organism: Secondary | ICD-10-CM

## 2018-11-08 DIAGNOSIS — R0602 Shortness of breath: Secondary | ICD-10-CM | POA: Diagnosis not present

## 2018-11-08 DIAGNOSIS — Z7982 Long term (current) use of aspirin: Secondary | ICD-10-CM

## 2018-11-08 DIAGNOSIS — R739 Hyperglycemia, unspecified: Secondary | ICD-10-CM | POA: Diagnosis present

## 2018-11-08 DIAGNOSIS — A400 Sepsis due to streptococcus, group A: Secondary | ICD-10-CM | POA: Diagnosis not present

## 2018-11-08 DIAGNOSIS — Z7901 Long term (current) use of anticoagulants: Secondary | ICD-10-CM

## 2018-11-08 DIAGNOSIS — S61411A Laceration without foreign body of right hand, initial encounter: Secondary | ICD-10-CM | POA: Diagnosis present

## 2018-11-08 DIAGNOSIS — I255 Ischemic cardiomyopathy: Secondary | ICD-10-CM | POA: Diagnosis present

## 2018-11-08 DIAGNOSIS — N183 Chronic kidney disease, stage 3 (moderate): Secondary | ICD-10-CM | POA: Diagnosis present

## 2018-11-08 DIAGNOSIS — S51811A Laceration without foreign body of right forearm, initial encounter: Secondary | ICD-10-CM | POA: Diagnosis present

## 2018-11-08 DIAGNOSIS — E1122 Type 2 diabetes mellitus with diabetic chronic kidney disease: Secondary | ICD-10-CM | POA: Diagnosis present

## 2018-11-08 DIAGNOSIS — E860 Dehydration: Secondary | ICD-10-CM | POA: Diagnosis present

## 2018-11-08 DIAGNOSIS — W5503XA Scratched by cat, initial encounter: Secondary | ICD-10-CM

## 2018-11-08 DIAGNOSIS — R6521 Severe sepsis with septic shock: Secondary | ICD-10-CM | POA: Diagnosis present

## 2018-11-08 DIAGNOSIS — Z955 Presence of coronary angioplasty implant and graft: Secondary | ICD-10-CM

## 2018-11-08 DIAGNOSIS — S61412A Laceration without foreign body of left hand, initial encounter: Secondary | ICD-10-CM | POA: Diagnosis present

## 2018-11-08 DIAGNOSIS — I252 Old myocardial infarction: Secondary | ICD-10-CM

## 2018-11-08 DIAGNOSIS — Z79899 Other long term (current) drug therapy: Secondary | ICD-10-CM

## 2018-11-08 DIAGNOSIS — R6520 Severe sepsis without septic shock: Secondary | ICD-10-CM

## 2018-11-08 DIAGNOSIS — Z20828 Contact with and (suspected) exposure to other viral communicable diseases: Secondary | ICD-10-CM | POA: Diagnosis present

## 2018-11-08 MED ORDER — SODIUM CHLORIDE 0.9 % IV BOLUS
1000.0000 mL | Freq: Once | INTRAVENOUS | Status: AC
  Administered 2018-11-08: 1000 mL via INTRAVENOUS

## 2018-11-08 NOTE — ED Triage Notes (Signed)
Pt c/o sob that started today and fever for a few days.  Seen yesterday and d/c . Had tylenol around 5pm today

## 2018-11-08 NOTE — ED Provider Notes (Signed)
[facility] EMERGENCY DEPARTMENT Provider Note   CSN: [id number] Arrival date & time: 11/08/18  2125    History   Chief Complaint Chief Complaint  Patient presents with  . Shortness of Breath    HPI ELDWIN [name] a 53 y.o. male with PMHx HTN, HLD, A fib on Xarelto, cardiomyopathy with EF 35%, and CAD who presents to the ED complaining of worsening shortness of breath that began this afternoon. Pt was seen in the ED 2 days ago for fever with Tmax 103 and diarrhea; his PCP sent him over to be tested for covid 19 for which he was negative. Pt states he began feeling short of breath today, prompting him to return to the ED. He is still having diarrhea and reports 5-6 episodes per day; denies any blood in stool or melena. Denies abdominal pain, nausea, vomiting, hematemesis, chest pain, or any other associated symptoms. No recent prolonged travel or immobilization. No hemoptysis. No hx DVT/PE. No active malignancy. Last stress test 2018 which was normal.        Past Medical History:  Diagnosis Date  . CAD (coronary artery disease)    post anterior wall myocardial infarction in 1999 with PCI and stenting at [facility].  . Heart attack (HCC)   . History of stress test 12/2010   EF of 36% with apical scar  . Hx of echocardiogram 02/13/2012   EF 35%. There is apical dyskinesis  . Hx of echocardiogram 05/09/2012   difinity contrast that showed no apical mural thrombus with an EF of 30%-35%  . Hyperlipidemia   . Hypertension     Patient Active Problem List   Diagnosis Date Noted  . Cat scratch fever 08/31/2018  . Atrial fibrillation (HCC) 01/19/2016  . Essential hypertension 02/11/2013  . Hyperlipidemia 02/11/2013  . Cardiomyopathy, ischemic 02/11/2013    Past Surgical History:  Procedure Laterality Date  . BACK SURGERY    . CORONARY ANGIOPLASTY          Home Medications    Prior to Admission medications   Medication Sig Start Date End Date Taking? Authorizing  Provider  acetaminophen (TYLENOL) 325 MG tablet Take 2 tablets (650 mg total) by mouth every 6 (six) hours as needed for mild pain (or Fever >/= 101). 09/03/18   Emokpae, Courage, MD  aspirin 81 MG tablet Take 1 tablet (81 mg total) by mouth daily with breakfast. 09/03/18   Mariea Clonts, Courage, MD  atorvastatin (LIPITOR) 40 MG tablet TAKE 1 TABLET BY MOUTH AT BEDTIME 02/12/18   Runell Gess, MD  benazepril (LOTENSIN) 20 MG tablet TAKE 1 TABLET BY MOUTH EVERY DAY 02/12/18   Runell Gess, MD  carvedilol (COREG) 25 MG tablet Take 1 tablet (25 mg total) by mouth 2 (two) times daily. 11/27/17 08/31/18  Runell Gess, MD  fish oil-omega-3 fatty acids 1000 MG capsule Take 1 g by mouth 2 (two) times daily.    [provider]  Multiple Vitamin (MULTIVITAMIN WITH MINERALS) TABS tablet Take 1 tablet by mouth daily.    [provider]  niacin 500 MG tablet Take 500 mg by mouth daily with breakfast.    [provider]  XARELTO 20 MG TABS tablet TAKE ONE TABLET BY MOUTH ONCE DAILY WITH SUPPER 10/29/18   Runell Gess, MD    Family History Family History  Problem Relation Age of Onset  . Cancer Father   . Heart Problems Maternal Grandmother   . Stroke Maternal Grandfather   .  Heart attack Paternal Grandfather     Social History Social History   Tobacco Use  . Smoking status: Never Smoker  . Smokeless tobacco: Never Used  Substance Use Topics  . Alcohol use: No  . Drug use: Never     Allergies   Patient has no known allergies.   Review of Systems Review of Systems  Constitutional: Positive for chills. Negative for fever.  HENT: Negative for congestion.   Eyes: Negative for redness.  Respiratory: Positive for shortness of breath. Negative for cough.   Cardiovascular: Negative for chest pain.  Gastrointestinal: Positive for diarrhea. Negative for abdominal pain, blood in stool, nausea and vomiting.  Genitourinary: Negative for dysuria.  Musculoskeletal:  Negative for myalgias.  Skin: Negative for rash.  Neurological: Negative for dizziness, weakness, light-headedness, numbness and headaches.     Physical Exam Updated Vital Signs BP (S) (!) 60/58 (BP Location: Right Arm) Comment: Ventor, PA aware  Pulse (!) 120   Temp 98.1 F (36.7 C) (Oral)   Resp (!) 36   Ht 5\' 9"  (1.753 m)   Wt 106 kg   SpO2 97%   BMI 34.51 kg/m   Physical Exam Vitals signs and nursing note reviewed.  Constitutional:      Appearance: He is not ill-appearing.  HENT:     Head: Normocephalic and atraumatic.  Eyes:     Conjunctiva/sclera: Conjunctivae normal.  Neck:     Musculoskeletal: Normal range of motion and neck supple.  Cardiovascular:     Rate and Rhythm: Rhythm irregularly irregular.  Pulmonary:     Effort: Tachypnea present.     Breath sounds: Normal breath sounds. No decreased breath sounds, wheezing, rhonchi or rales.  Chest:     Chest wall: No tenderness.  Abdominal:     Palpations: Abdomen is soft.     Tenderness: There is no abdominal tenderness. There is no guarding or rebound.  Musculoskeletal:     Right lower leg: No edema.     Left lower leg: No edema.  Lymphadenopathy:     Cervical: No cervical adenopathy.  Skin:    General: Skin is warm and dry.  Neurological:     Mental Status: He is alert.      ED Treatments / Results  Labs (all labs ordered are listed, but only abnormal results are displayed) Labs Reviewed  BASIC METABOLIC PANEL - Abnormal; Notable for the following components:      Result Value   Sodium 128 (*)    Chloride 93 (*)    CO2 17 (*)    Glucose, Bld 209 (*)    BUN 58 (*)    Creatinine, Ser 5.37 (*)    Calcium 8.1 (*)    GFR calc non Af Amer 11 (*)    GFR calc Af Amer 13 (*)    Anion gap 18 (*)    All other components within normal limits  CBC - Abnormal; Notable for the following components:   RBC 4.14 (*)    MCH 34.5 (*)    MCHC 36.3 (*)    Platelets 86 (*)    All other components within  normal limits  BRAIN NATRIURETIC PEPTIDE - Abnormal; Notable for the following components:   B Natriuretic Peptide 365.0 (*)    All other components within normal limits  URINALYSIS, ROUTINE W REFLEX MICROSCOPIC - Abnormal; Notable for the following components:   Color, Urine AMBER (*)    APPearance HAZY (*)    All other components within  normal limits  SARS CORONAVIRUS 2 (HOSPITAL ORDER, PERFORMED IN [facility])  GASTROINTESTINAL PANEL BY PCR, STOOL (REPLACES STOOL CULTURE)  CULTURE, BLOOD (ROUTINE X 2)  CULTURE, BLOOD (ROUTINE X 2)  TROPONIN I  LACTIC ACID, PLASMA  LACTIC ACID, PLASMA    EKG EKG Interpretation  Date/Time:  Thursday Nov 08 2018 21:46:01 EDT Ventricular Rate:  112 PR Interval:    QRS Duration: 98 QT Interval:  332 QTC Calculation: 435 R Axis:   -85 Text Interpretation:  Atrial fibrillation Multiple ventricular premature complexes Minimal ST depression, diffuse leads Confirmed by Raeford Razor [phone]) on 11/08/2018 10:54:23 PM   Radiology Dg Chest Portable 1 View  Result Date: 11/09/2018 CLINICAL DATA:  Shortness of breath and fever EXAM: PORTABLE CHEST 1 VIEW COMPARISON:  11/06/2018 FINDINGS: The heart size and mediastinal contours are within normal limits. Both lungs are clear. The visualized skeletal structures are unremarkable. IMPRESSION: No active disease. Electronically Signed   By: Deatra Robinson M.D.   On: 11/09/2018 00:22    Procedures Procedures (including critical care time)  Medications Ordered in ED Medications  sodium chloride 0.9 % bolus 1,000 mL (has no administration in time range)  metroNIDAZOLE (FLAGYL) IVPB 500 mg (has no administration in time range)  cefTRIAXone (ROCEPHIN) 1 g in sodium chloride 0.9 % 100 mL IVPB (has no administration in time range)  0.9 %  sodium chloride infusion (has no administration in time range)  sodium chloride 0.9 % bolus 1,000 mL (has no administration in time range)  sodium chloride 0.9 %  bolus 1,000 mL (1,000 mLs Intravenous New Bag/Given 11/08/18 2335)     Initial Impression / Assessment and Plan / ED Course  I have reviewed the triage vital signs and the nursing notes.  Pertinent labs & imaging results that were available during my care of the patient were reviewed by me and considered in my medical decision making (see chart for details).    Pt is a 53 year old male who presents to the ED with shortness of breath x 1 day. Was seen 2 days ago in the ED for diarrhea and fever, tested negative for covid 19. Pt had leukocytosis of 11.0 but all other bloodwork unremarkable; CXR clear. Pt currently tachypneic and in afib with RVR with hypotension of 60/58. LCTAB. Satting 99% on RA. Alert and oriented; able to transfer to chair from stretcher without issue. Dr. Juleen China attending physician evaluated patient as well; he is alert and responsive; likely in a fib due to hypovolemia from excessive diarrhea. Will hold off on rate control currently; 1L NS bolus given to determine if BP elevates. Baseline labs ordered as well including CBC, BMP, troponin, and BNP.   Pt initially presented without fever; while in the ED he became febrile to 102.2; given fever, tachycardia, and hypotension code sepsis called. Pt started on Ceftriaxone and Flagyl given creatinine of 5.37 which is significantly worse than 2 days ago. Maintenance fluids started as well.   Lab Results  Component Value Date   CREATININE 5.37 (H) 11/08/2018   CREATININE 1.16 11/06/2018   CREATININE 1.20 09/03/2018   12:49 AM At shift change case signed out to attending physician Dr. Clayborne Dana with plan for admission.        Final Clinical Impressions(s) / ED Diagnoses   Final diagnoses:  Sepsis with acute renal failure, due to unspecified organism, unspecified acute renal failure type, unspecified whether septic shock present [facility])    ED Discharge Orders  None       Tanda RockersVenter, Kileigh Ortmann, PA-C 11/09/18 0050    Raeford RazorKohut,  Stephen, MD 11/12/18 [phone]

## 2018-11-08 NOTE — ED Notes (Signed)
Dr. Kohut at bedside 

## 2018-11-09 ENCOUNTER — Inpatient Hospital Stay (HOSPITAL_COMMUNITY): Payer: BLUE CROSS/BLUE SHIELD

## 2018-11-09 DIAGNOSIS — Z20828 Contact with and (suspected) exposure to other viral communicable diseases: Secondary | ICD-10-CM | POA: Diagnosis present

## 2018-11-09 DIAGNOSIS — K529 Noninfective gastroenteritis and colitis, unspecified: Secondary | ICD-10-CM

## 2018-11-09 DIAGNOSIS — I130 Hypertensive heart and chronic kidney disease with heart failure and stage 1 through stage 4 chronic kidney disease, or unspecified chronic kidney disease: Secondary | ICD-10-CM | POA: Diagnosis present

## 2018-11-09 DIAGNOSIS — I5022 Chronic systolic (congestive) heart failure: Secondary | ICD-10-CM

## 2018-11-09 DIAGNOSIS — R739 Hyperglycemia, unspecified: Secondary | ICD-10-CM | POA: Diagnosis present

## 2018-11-09 DIAGNOSIS — R6521 Severe sepsis with septic shock: Secondary | ICD-10-CM | POA: Diagnosis present

## 2018-11-09 DIAGNOSIS — E1165 Type 2 diabetes mellitus with hyperglycemia: Secondary | ICD-10-CM | POA: Diagnosis present

## 2018-11-09 DIAGNOSIS — S61411A Laceration without foreign body of right hand, initial encounter: Secondary | ICD-10-CM | POA: Diagnosis present

## 2018-11-09 DIAGNOSIS — I255 Ischemic cardiomyopathy: Secondary | ICD-10-CM | POA: Diagnosis present

## 2018-11-09 DIAGNOSIS — N179 Acute kidney failure, unspecified: Secondary | ICD-10-CM

## 2018-11-09 DIAGNOSIS — I2510 Atherosclerotic heart disease of native coronary artery without angina pectoris: Secondary | ICD-10-CM | POA: Diagnosis present

## 2018-11-09 DIAGNOSIS — E1122 Type 2 diabetes mellitus with diabetic chronic kidney disease: Secondary | ICD-10-CM | POA: Diagnosis present

## 2018-11-09 DIAGNOSIS — E860 Dehydration: Secondary | ICD-10-CM | POA: Diagnosis present

## 2018-11-09 DIAGNOSIS — A400 Sepsis due to streptococcus, group A: Secondary | ICD-10-CM | POA: Diagnosis present

## 2018-11-09 DIAGNOSIS — Z7982 Long term (current) use of aspirin: Secondary | ICD-10-CM | POA: Diagnosis not present

## 2018-11-09 DIAGNOSIS — R0602 Shortness of breath: Secondary | ICD-10-CM | POA: Diagnosis present

## 2018-11-09 DIAGNOSIS — Z7901 Long term (current) use of anticoagulants: Secondary | ICD-10-CM | POA: Diagnosis not present

## 2018-11-09 DIAGNOSIS — S61412A Laceration without foreign body of left hand, initial encounter: Secondary | ICD-10-CM | POA: Diagnosis present

## 2018-11-09 DIAGNOSIS — W5503XA Scratched by cat, initial encounter: Secondary | ICD-10-CM | POA: Diagnosis not present

## 2018-11-09 DIAGNOSIS — I4891 Unspecified atrial fibrillation: Secondary | ICD-10-CM | POA: Diagnosis present

## 2018-11-09 DIAGNOSIS — R6520 Severe sepsis without septic shock: Secondary | ICD-10-CM

## 2018-11-09 DIAGNOSIS — A419 Sepsis, unspecified organism: Secondary | ICD-10-CM

## 2018-11-09 DIAGNOSIS — Z79899 Other long term (current) drug therapy: Secondary | ICD-10-CM | POA: Diagnosis not present

## 2018-11-09 DIAGNOSIS — E785 Hyperlipidemia, unspecified: Secondary | ICD-10-CM | POA: Diagnosis present

## 2018-11-09 DIAGNOSIS — I482 Chronic atrial fibrillation, unspecified: Secondary | ICD-10-CM | POA: Diagnosis not present

## 2018-11-09 DIAGNOSIS — S51811A Laceration without foreign body of right forearm, initial encounter: Secondary | ICD-10-CM | POA: Diagnosis present

## 2018-11-09 DIAGNOSIS — I252 Old myocardial infarction: Secondary | ICD-10-CM | POA: Diagnosis not present

## 2018-11-09 DIAGNOSIS — N183 Chronic kidney disease, stage 3 (moderate): Secondary | ICD-10-CM | POA: Diagnosis present

## 2018-11-09 DIAGNOSIS — E872 Acidosis, unspecified: Secondary | ICD-10-CM | POA: Diagnosis present

## 2018-11-09 DIAGNOSIS — I10 Essential (primary) hypertension: Secondary | ICD-10-CM | POA: Diagnosis not present

## 2018-11-09 DIAGNOSIS — S51812A Laceration without foreign body of left forearm, initial encounter: Secondary | ICD-10-CM | POA: Diagnosis present

## 2018-11-09 DIAGNOSIS — Z955 Presence of coronary angioplasty implant and graft: Secondary | ICD-10-CM | POA: Diagnosis not present

## 2018-11-09 LAB — CBC
HCT: 39.4 % (ref 39.0–52.0)
Hemoglobin: 14.3 g/dL (ref 13.0–17.0)
MCH: 34.5 pg — ABNORMAL HIGH (ref 26.0–34.0)
MCHC: 36.3 g/dL — ABNORMAL HIGH (ref 30.0–36.0)
MCV: 95.2 fL (ref 80.0–100.0)
Platelets: 86 10*3/uL — ABNORMAL LOW (ref 150–400)
RBC: 4.14 MIL/uL — ABNORMAL LOW (ref 4.22–5.81)
RDW: 13.1 % (ref 11.5–15.5)
WBC: 8 10*3/uL (ref 4.0–10.5)
nRBC: 0 % (ref 0.0–0.2)

## 2018-11-09 LAB — MRSA PCR SCREENING: MRSA by PCR: NEGATIVE

## 2018-11-09 LAB — COMPREHENSIVE METABOLIC PANEL
ALT: 54 U/L — ABNORMAL HIGH (ref 0–44)
AST: 62 U/L — ABNORMAL HIGH (ref 15–41)
Albumin: 2.9 g/dL — ABNORMAL LOW (ref 3.5–5.0)
Alkaline Phosphatase: 25 U/L — ABNORMAL LOW (ref 38–126)
Anion gap: 14 (ref 5–15)
BUN: 63 mg/dL — ABNORMAL HIGH (ref 6–20)
CO2: 15 mmol/L — ABNORMAL LOW (ref 22–32)
Calcium: 7.2 mg/dL — ABNORMAL LOW (ref 8.9–10.3)
Chloride: 100 mmol/L (ref 98–111)
Creatinine, Ser: 3.96 mg/dL — ABNORMAL HIGH (ref 0.61–1.24)
GFR calc Af Amer: 19 mL/min — ABNORMAL LOW (ref >60)
GFR calc non Af Amer: 16 mL/min — ABNORMAL LOW (ref >60)
Glucose, Bld: 218 mg/dL — ABNORMAL HIGH (ref 70–99)
Potassium: 3.7 mmol/L (ref 3.5–5.1)
Sodium: 129 mmol/L — ABNORMAL LOW (ref 135–145)
Total Bilirubin: 2.2 mg/dL — ABNORMAL HIGH (ref 0.3–1.2)
Total Protein: 6 g/dL — ABNORMAL LOW (ref 6.5–8.1)

## 2018-11-09 LAB — BLOOD CULTURE ID PANEL (REFLEXED)

## 2018-11-09 LAB — CBC WITH DIFFERENTIAL/PLATELET
Abs Immature Granulocytes: 0.34 10*3/uL — ABNORMAL HIGH (ref 0.00–0.07)
Basophils Absolute: 0 10*3/uL (ref 0.0–0.1)
Basophils Relative: 0 %
Eosinophils Absolute: 0 10*3/uL (ref 0.0–0.5)
Eosinophils Relative: 0 %
HCT: 36.1 % — ABNORMAL LOW (ref 39.0–52.0)
Hemoglobin: 12.9 g/dL — ABNORMAL LOW (ref 13.0–17.0)
Immature Granulocytes: 3 %
Lymphocytes Relative: 2 %
Lymphs Abs: 0.2 10*3/uL — ABNORMAL LOW (ref 0.7–4.0)
MCH: 34.1 pg — ABNORMAL HIGH (ref 26.0–34.0)
MCHC: 35.7 g/dL (ref 30.0–36.0)
MCV: 95.5 fL (ref 80.0–100.0)
Monocytes Absolute: 0.7 10*3/uL (ref 0.1–1.0)
Monocytes Relative: 6 %
Neutro Abs: 10.9 10*3/uL — ABNORMAL HIGH (ref 1.7–7.7)
Neutrophils Relative %: 89 %
Platelets: 92 10*3/uL — ABNORMAL LOW (ref 150–400)
RBC: 3.78 MIL/uL — ABNORMAL LOW (ref 4.22–5.81)
RDW: 13.2 % (ref 11.5–15.5)
WBC: 12.1 10*3/uL — ABNORMAL HIGH (ref 4.0–10.5)
nRBC: 0 % (ref 0.0–0.2)

## 2018-11-09 LAB — TROPONIN I: Troponin I: <0.03 ng/mL (ref <0.03)

## 2018-11-09 LAB — URINALYSIS, ROUTINE W REFLEX MICROSCOPIC
Bilirubin Urine: NEGATIVE
Glucose, UA: NEGATIVE mg/dL
Hgb urine dipstick: NEGATIVE
Ketones, ur: NEGATIVE mg/dL
Leukocytes,Ua: NEGATIVE
Nitrite: NEGATIVE
Protein, ur: NEGATIVE mg/dL
Specific Gravity, Urine: 1.023 (ref 1.005–1.030)
pH: 5 (ref 5.0–8.0)

## 2018-11-09 LAB — PROTIME-INR
INR: 2 — ABNORMAL HIGH (ref 0.8–1.2)
Prothrombin Time: 22.2 s — ABNORMAL HIGH (ref 11.4–15.2)

## 2018-11-09 LAB — HEPATIC FUNCTION PANEL
ALT: 53 U/L — ABNORMAL HIGH (ref 0–44)
AST: 60 U/L — ABNORMAL HIGH (ref 15–41)
Albumin: 3.2 g/dL — ABNORMAL LOW (ref 3.5–5.0)
Alkaline Phosphatase: 26 U/L — ABNORMAL LOW (ref 38–126)
Bilirubin, Direct: 1 mg/dL — ABNORMAL HIGH (ref 0.0–0.2)
Indirect Bilirubin: 1.1 mg/dL — ABNORMAL HIGH (ref 0.3–0.9)
Total Bilirubin: 2.1 mg/dL — ABNORMAL HIGH (ref 0.3–1.2)
Total Protein: 6.6 g/dL (ref 6.5–8.1)

## 2018-11-09 LAB — BASIC METABOLIC PANEL
Anion gap: 18 — ABNORMAL HIGH (ref 5–15)
BUN: 58 mg/dL — ABNORMAL HIGH (ref 6–20)
CO2: 17 mmol/L — ABNORMAL LOW (ref 22–32)
Calcium: 8.1 mg/dL — ABNORMAL LOW (ref 8.9–10.3)
Chloride: 93 mmol/L — ABNORMAL LOW (ref 98–111)
Creatinine, Ser: 5.37 mg/dL — ABNORMAL HIGH (ref 0.61–1.24)
GFR calc Af Amer: 13 mL/min — ABNORMAL LOW (ref >60)
GFR calc non Af Amer: 11 mL/min — ABNORMAL LOW (ref >60)
Glucose, Bld: 209 mg/dL — ABNORMAL HIGH (ref 70–99)
Potassium: 3.9 mmol/L (ref 3.5–5.1)
Sodium: 128 mmol/L — ABNORMAL LOW (ref 135–145)

## 2018-11-09 LAB — GLUCOSE, CAPILLARY: Glucose-Capillary: 244 mg/dL — ABNORMAL HIGH (ref 70–99)

## 2018-11-09 LAB — SARS CORONAVIRUS 2 BY RT PCR (HOSPITAL ORDER, PERFORMED IN ~~LOC~~ HOSPITAL LAB): SARS Coronavirus 2: NEGATIVE

## 2018-11-09 LAB — BRAIN NATRIURETIC PEPTIDE: B Natriuretic Peptide: 365 pg/mL — ABNORMAL HIGH (ref 0.0–100.0)

## 2018-11-09 LAB — LACTIC ACID, PLASMA
Lactic Acid, Venous: 4.6 mmol/L (ref 0.5–1.9)
Lactic Acid, Venous: 4.1 mmol/L (ref 0.5–1.9)
Lactic Acid, Venous: 3.3 mmol/L (ref 0.5–1.9)
Lactic Acid, Venous: 3 mmol/L (ref 0.5–1.9)

## 2018-11-09 LAB — DIC (DISSEMINATED INTRAVASCULAR COAGULATION)PANEL
D-Dimer, Quant: 1.91 ug/mL-FEU — ABNORMAL HIGH (ref 0.00–0.50)
Fibrinogen: >800 mg/dL — ABNORMAL HIGH (ref 210–475)
INR: 1.8 — ABNORMAL HIGH (ref 0.8–1.2)
Platelets: 91 10*3/uL — ABNORMAL LOW (ref 150–400)
Prothrombin Time: 20.8 seconds — ABNORMAL HIGH (ref 11.4–15.2)
Smear Review: NONE SEEN
aPTT: 42 seconds — ABNORMAL HIGH (ref 24–36)

## 2018-11-09 LAB — APTT: aPTT: 41 s — ABNORMAL HIGH (ref 24–36)

## 2018-11-09 LAB — SAVE SMEAR(SSMR), FOR PROVIDER SLIDE REVIEW

## 2018-11-09 MED ORDER — ONDANSETRON HCL 4 MG/2ML IJ SOLN: 4.0000 mg | Freq: Four times a day (QID) | INTRAMUSCULAR | Status: DC | PRN

## 2018-11-09 MED ORDER — ACETAMINOPHEN 325 MG PO TABS
650.0000 mg | ORAL_TABLET | Freq: Once | ORAL | Status: AC
  Administered 2018-11-09: 650 mg via ORAL
  Filled 2018-11-09: qty 2

## 2018-11-09 MED ORDER — SODIUM CHLORIDE 0.9 % IV SOLN
1.0000 g | Freq: Once | INTRAVENOUS | Status: AC
  Administered 2018-11-09: 1 g via INTRAVENOUS
  Filled 2018-11-09: qty 10

## 2018-11-09 MED ORDER — SODIUM CHLORIDE 0.9 % IV BOLUS (SEPSIS): 500.0000 mL | Freq: Once | INTRAVENOUS | Status: DC

## 2018-11-09 MED ORDER — NOREPINEPHRINE 4 MG/250ML-% IV SOLN
0.0000 ug/min | INTRAVENOUS | Status: DC
  Administered 2018-11-09: 2 ug/min via INTRAVENOUS
  Filled 2018-11-09 (×2): qty 250

## 2018-11-09 MED ORDER — SODIUM CHLORIDE 0.9 % IV SOLN
1000.0000 mL | INTRAVENOUS | Status: AC
  Administered 2018-11-09: 1000 mL via INTRAVENOUS

## 2018-11-09 MED ORDER — SODIUM CHLORIDE 0.9 % IV BOLUS
1000.0000 mL | Freq: Once | INTRAVENOUS | Status: AC
  Administered 2018-11-09: 1000 mL via INTRAVENOUS

## 2018-11-09 MED ORDER — ONDANSETRON HCL 4 MG PO TABS: 4.0000 mg | ORAL_TABLET | Freq: Four times a day (QID) | ORAL | Status: DC | PRN

## 2018-11-09 MED ORDER — SODIUM CHLORIDE 0.9 % IV BOLUS (SEPSIS): 1000.0000 mL | Freq: Once | INTRAVENOUS | Status: DC

## 2018-11-09 MED ORDER — METRONIDAZOLE IN NACL 5-0.79 MG/ML-% IV SOLN
500.0000 mg | Freq: Once | INTRAVENOUS | Status: AC
  Administered 2018-11-09: 01:00:00 500 mg via INTRAVENOUS
  Filled 2018-11-09: qty 100

## 2018-11-09 MED ORDER — SODIUM CHLORIDE 0.9 % IV SOLN
1000.0000 mL | INTRAVENOUS | Status: DC
  Administered 2018-11-09: 1000 mL via INTRAVENOUS

## 2018-11-09 MED ORDER — SODIUM CHLORIDE 0.9 % IV BOLUS: 1000.0000 mL | Freq: Once | INTRAVENOUS | Status: DC

## 2018-11-09 MED ORDER — SODIUM CHLORIDE 0.9 % IV BOLUS (SEPSIS)
1000.0000 mL | Freq: Once | INTRAVENOUS | Status: AC
  Administered 2018-11-09: 1000 mL via INTRAVENOUS

## 2018-11-09 MED ORDER — HYDROCORTISONE NA SUCCINATE PF 100 MG IJ SOLR
100.0000 mg | Freq: Once | INTRAMUSCULAR | Status: AC
  Administered 2018-11-09: 01:00:00 100 mg via INTRAVENOUS
  Filled 2018-11-09: qty 2

## 2018-11-09 MED ORDER — METRONIDAZOLE IN NACL 5-0.79 MG/ML-% IV SOLN
500.0000 mg | Freq: Three times a day (TID) | INTRAVENOUS | Status: DC
  Administered 2018-11-09 – 2018-11-10 (×4): 500 mg via INTRAVENOUS
  Filled 2018-11-09 (×4): qty 100

## 2018-11-09 MED ORDER — SODIUM CHLORIDE 0.9 % IV SOLN
2.0000 g | INTRAVENOUS | Status: DC
  Administered 2018-11-10: 09:00:00 2 g via INTRAVENOUS
  Filled 2018-11-09: qty 2

## 2018-11-09 MED ORDER — SODIUM CHLORIDE 0.9 % IV SOLN
2.0000 g | Freq: Once | INTRAVENOUS | Status: AC
  Administered 2018-11-09: 2 g via INTRAVENOUS
  Filled 2018-11-09: qty 2

## 2018-11-09 MED ORDER — SODIUM CHLORIDE 0.9 % IV SOLN: INTRAVENOUS | Status: DC

## 2018-11-09 MED ORDER — ACETAMINOPHEN 325 MG PO TABS: 650.0000 mg | ORAL_TABLET | Freq: Four times a day (QID) | ORAL | Status: DC | PRN

## 2018-11-09 MED ORDER — ACETAMINOPHEN 650 MG RE SUPP: 650.0000 mg | Freq: Four times a day (QID) | RECTAL | Status: DC | PRN

## 2018-11-09 NOTE — ED Notes (Signed)
CRITICAL VALUE ALERT  Critical Value:  Lactic 4.1  Date & Time Notied:  11/09/2018 0244  Provider Notified: Dr. Clayborne Dana  Orders Received/Actions taken: See chart

## 2018-11-09 NOTE — Progress Notes (Addendum)
Pharmacy Antibiotic Note  William [name] a 53 y.o. male admitted on 11/08/2018 with sepsis.  Pharmacy has been consulted for Cefepime dosing.  Plan: Cefepime 2000 mg IV every 24 hours.  Monitor labs, c/s, and patient improvement.  Height: 5' 9.5" (176.5 cm) Weight: 240 lb 4.8 oz (109 kg) IBW/kg (Calculated) : 71.85  Temp (24hrs), Avg:100 F (37.8 C), Min:98.1 F (36.7 C), Max:102.2 F (39 C)  Recent Labs  Lab 11/06/18 1115 11/08/18 2325 11/09/18 0009 11/09/18 0212 11/09/18 0531  WBC 11.0* 8.0  --   --   --   CREATININE 1.16 5.37*  --   --   --   LATICACIDVEN  --   --  4.6* 4.1* 3.3*    Estimated Creatinine Clearance: 20 mL/min (A) (by C-G formula based on SCr of 5.37 mg/dL (H)).    Allergies  Allergen Reactions  . Aspirin     Cannot take due to being on blood thinner-per pt    Antimicrobials this admission: Cefepime 5/15 >>  Flagyl 5/15 >>    Dose adjustments this admission: Cefepime  Microbiology results: 5/15 BCx: pending   5/15 MRSA PCR: negative  Thank you for allowing pharmacy to be a part of this patient's care.  Tad Moore 11/09/2018 8:03 AM

## 2018-11-09 NOTE — Progress Notes (Signed)
Positive blood cultures for all three bottles received( gram positive cocci).  On-call Md notified of these results via text page, and was given the BCID.  Will continue to monitor.

## 2018-11-09 NOTE — ED Notes (Signed)
Pt given ice water per Dr Clayborne Dana okay. Pt sitting in recliner chair as this is most comfortable per pt report.

## 2018-11-09 NOTE — Progress Notes (Signed)
Patient seen and examined.  Admitted after midnight secondary to not feeling good, shortness of breath, fever and chills.  Patient found meeting criteria for severe sepsis with septic shock.  He was started empirically on IV antibiotics and received fluid resuscitation along with transient use of pressors.  Vital signs significantly improve and the patient has remained stable enough to be transfer to telemetry bed.  Blood cultures are pending and a stool studies has been collected.  Will continue supportive care, further judicious fluid resuscitation and empiric IV antibiotics.  Please refer to H&P written by Dr. Robb Matar on 11/09/2018 for further info/details on admission.  Plan: -Continue IV antibiotics -Continue judicious IV fluids -Follow cultures report on his stool studies. -Continue supportive care -Continue holding diuretics and antihypertensive agents at this time -Follow daily weights and strict intake and output -Transfer patient to telemetry bed.  Vassie Loll MD [phone]

## 2018-11-09 NOTE — ED Provider Notes (Signed)
Physical Exam  BP (!) 81/54   Pulse (!) 121   Temp (!) 101 F (38.3 C) (Rectal)   Resp (!) 27   Ht 5\' 9"  (1.753 m)   Wt 106 kg   SpO2 98%   BMI 34.51 kg/m   Physical Exam Vitals signs and nursing note reviewed.  Constitutional:      Appearance: He is ill-appearing.  HENT:     Head: Atraumatic.  Eyes:     Pupils: Pupils are equal, round, and reactive to light.  Neck:     Musculoskeletal: Normal range of motion.  Cardiovascular:     Rate and Rhythm: Tachycardia present. Rhythm irregular.  Pulmonary:     Effort: Tachypnea present. No respiratory distress.  Abdominal:     Palpations: Abdomen is soft. There is no mass.     Tenderness: There is no guarding or rebound.  Musculoskeletal: Normal range of motion.     Right lower leg: He exhibits no tenderness. No edema.  Neurological:     General: No focal deficit present.     Mental Status: He is alert.     ED Course/Procedures     .Critical Care Performed by: Marily MemosMesner, Shonique Pelphrey, MD Authorized by: Marily MemosMesner, Jannell Franta, MD   Critical care provider statement:    Critical care time (minutes):  45   Critical care was necessary to treat or prevent imminent or life-threatening deterioration of the following conditions:  Cardiac failure, circulatory failure, dehydration, shock, sepsis and metabolic crisis   Critical care was time spent personally by me on the following activities:  Discussions with consultants, evaluation of patient's response to treatment, examination of patient, ordering and performing treatments and interventions, ordering and review of laboratory studies, ordering and review of radiographic studies, pulse oximetry, re-evaluation of patient's condition, obtaining history from patient or surrogate and review of old charts .Central Line Date/Time: 11/09/2018 3:54 AM Performed by: Marily MemosMesner, Aniah Pauli, MD Authorized by: Marily MemosMesner, Anguel Delapena, MD   Consent:    Consent obtained:  Verbal   Risks discussed:  Incorrect placement, infection,  bleeding, arterial puncture, nerve damage and pneumothorax   Alternatives discussed:  No treatment, delayed treatment, alternative treatment and observation Pre-procedure details:    Hand hygiene: Hand hygiene performed prior to insertion     Sterile barrier technique: All elements of maximal sterile technique followed     Skin preparation:  2% chlorhexidine   Skin preparation agent: Skin preparation agent completely dried prior to procedure   Procedure details:    Location:  R internal jugular   Site selection rationale:  Tolerated best   Patient position: sitting.   Procedural supplies:  Triple lumen   Catheter size:  7 Fr   Ultrasound guidance: yes     Sterile ultrasound techniques: Sterile gel and sterile probe covers were used     Number of attempts:  1   Successful placement: yes   Post-procedure details:    Post-procedure:  Dressing applied and line sutured   Assessment:  Blood return through all ports, free fluid flow and no pneumothorax on x-ray   Patient tolerance of procedure:  Tolerated well, no immediate complications    MDM   Assumed care from physician assistant.  At time of transfer of care pending labs and fluid infusions and reevaluation for hypotension and likely sepsis.  Antibiotics discussed with physician assistant decide on Rocephin and Flagyl secondary to the diarrhea and suspicious for intra-abdominal infection.  Chest x-ray is clear.  Patient has a bunch of scratches  but none of them appear to be infected. Labs with low platelets, normal Hb and AKI with elevated lactic acid, considered possible TTP so a DIC panel done, steroids given and did not improve. Continued with low BP's and lactate didn't improve much, not making urine, BUS without evidence of Right heart enlargement or hyperactive cardiac activity. I felt he was fluid resuscitated so central line placed as above and levophed started. Discussed with medicine who will admit.         Adan Baehr, Barbara Cower,  MD 11/09/18 [phone]

## 2018-11-09 NOTE — ED Notes (Signed)
ED TO INPATIENT HANDOFF REPORT  ED Nurse Name and Phone #: Marisa Hua, RN  S Name/Age/Gender William Hill 53 y.o. male Room/Bed: APA04/APA04  Code Status   Code Status: Full Code  Home/SNF/Other Home Patient oriented to: self, place, time and situation Is this baseline? Yes   Triage Complete: Triage complete  Chief Complaint Sob   Triage Note Pt c/o sob that started today and fever for a few days.  Seen yesterday and d/c . Had tylenol around 5pm today    Allergies Allergies  Allergen Reactions  . Aspirin     Cannot take due to being on blood thinner-per pt    Level of Care/Admitting Diagnosis ED Disposition    ED Disposition Condition Comment   Admit  Hospital Area: [facility] [[id number]]  Level of Care: ICU [6]  Covid Evaluation: Screening Protocol (No Symptoms)  Diagnosis: Septic shock due to undetermined organism [facility]) [[id number]]  Admitting Physician: Bobette Mo [[id number]]  Attending Physician: Bobette Mo [[id number]]  Estimated length of stay: past midnight tomorrow  Certification:: I certify this patient will need inpatient services for at least 2 midnights  PT Class (Do Not Modify): Inpatient [101]  PT Acc Code (Do Not Modify): Private [1]       B Medical/Surgery History Past Medical History:  Diagnosis Date  . CAD (coronary artery disease)    post anterior wall myocardial infarction in 1999 with PCI and stenting at [facility].  . Heart attack (HCC)   . History of stress test 12/2010   EF of 36% with apical scar  . Hx of echocardiogram 02/13/2012   EF 35%. There is apical dyskinesis  . Hx of echocardiogram 05/09/2012   difinity contrast that showed no apical mural thrombus with an EF of 30%-35%  . Hyperlipidemia   . Hypertension    Past Surgical History:  Procedure Laterality Date  . BACK SURGERY    . CORONARY ANGIOPLASTY       A IV Location/Drains/Wounds Patient Lines/Drains/Airways Status   Active  Line/Drains/Airways    Name:   Placement date:   Placement time:   Site:   Days:   Peripheral IV 11/08/18 Right Antecubital   11/08/18    2330    Antecubital   1   Peripheral IV 11/09/18 Left;Posterior Forearm   11/09/18    0107    Forearm   less than 1   CVC Triple Lumen 11/09/18 Right Internal jugular   11/09/18    0340     less than 1          Intake/Output Last 24 hours  Intake/Output Summary (Last 24 hours) at 11/09/2018 0445 Last data filed at 11/09/2018 0257 Gross per 24 hour  Intake 4200 ml  Output -  Net 4200 ml    Labs/Imaging Results for orders placed or performed during the hospital encounter of 11/08/18 (from the past 48 hour(s))  Basic metabolic panel     Status: Abnormal   Collection Time: 11/08/18 11:25 PM  Result Value Ref Range   Sodium 128 (L) 135 - 145 mmol/L   Potassium 3.9 3.5 - 5.1 mmol/L   Chloride 93 (L) 98 - 111 mmol/L   CO2 17 (L) 22 - 32 mmol/L   Glucose, Bld 209 (H) 70 - 99 mg/dL   BUN 58 (H) 6 - 20 mg/dL   Creatinine, Ser 0.07 (H) 0.61 - 1.24 mg/dL   Calcium 8.1 (L) 8.9 - 10.3 mg/dL   GFR calc  non Af Amer 11 (L) >60 mL/min   GFR calc Af Amer 13 (L) >60 mL/min   Anion gap 18 (H) 5 - 15    Comment: Performed at [facility], [address] [place], [place] [address]  CBC     Status: Abnormal   Collection Time: 11/08/18 11:25 PM  Result Value Ref Range   WBC 8.0 4.0 - 10.5 K/uL   RBC 4.14 (L) 4.22 - 5.81 MIL/uL   Hemoglobin 14.3 13.0 - 17.0 g/dL   HCT 60.4 54.0 - 98.1 %   MCV 95.2 80.0 - 100.0 fL   MCH 34.5 (H) 26.0 - 34.0 pg   MCHC 36.3 (H) 30.0 - 36.0 g/dL   RDW 19.1 47.8 - 29.5 %   Platelets 86 (L) 150 - 400 K/uL    Comment: PLATELET COUNT CONFIRMED BY SMEAR SPECIMEN CHECKED FOR CLOTS Immature Platelet Fraction may be clinically indicated, consider ordering this additional test [id number]    nRBC 0.0 0.0 - 0.2 %    Comment: Performed at [facility], [address] [place], [place] [address]  Brain natriuretic peptide (IF shortness  of breath has been documented this visit)     Status: Abnormal   Collection Time: 11/08/18 11:25 PM  Result Value Ref Range   B Natriuretic Peptide 365.0 (H) 0.0 - 100.0 pg/mL    Comment: Performed at [facility], [address] [place], [place] [address]  Troponin I - ONCE - STAT     Status: None   Collection Time: 11/08/18 11:25 PM  Result Value Ref Range   Troponin I <0.03 <0.03 ng/mL    Comment: Performed at [facility], [address] [place], [place] [address]  Urinalysis, Routine w reflex microscopic     Status: Abnormal   Collection Time: 11/08/18 11:36 PM  Result Value Ref Range   Color, Urine AMBER (A) YELLOW    Comment: BIOCHEMICALS MAY BE AFFECTED BY COLOR   APPearance HAZY (A) CLEAR   Specific Gravity, Urine 1.023 1.005 - 1.030   pH 5.0 5.0 - 8.0   Glucose, UA NEGATIVE NEGATIVE mg/dL   Hgb urine dipstick NEGATIVE NEGATIVE   Bilirubin Urine NEGATIVE NEGATIVE   Ketones, ur NEGATIVE NEGATIVE mg/dL   Protein, ur NEGATIVE NEGATIVE mg/dL   Nitrite NEGATIVE NEGATIVE   Leukocytes,Ua NEGATIVE NEGATIVE    Comment: Performed at [facility], [address], [place], [place] [address]  Lactic acid, plasma     Status: Abnormal   Collection Time: 11/09/18 12:09 AM  Result Value Ref Range   Lactic Acid, Venous 4.6 (HH) 0.5 - 1.9 mmol/L    Comment: CRITICAL RESULT CALLED TO, READ BACK BY AND VERIFIED WITH: B NORMAN,RN  11/09/18 MKELLY Performed at [facility], [address], [place], [place] [address]   SARS Coronavirus 2 (CEPHEID- Performed in [facility] hospital lab), Hosp Order     Status: None   Collection Time: 11/09/18 12:31 AM  Result Value Ref Range   SARS Coronavirus 2 NEGATIVE NEGATIVE    Comment: (NOTE) If result is NEGATIVE SARS-CoV-2 target nucleic acids are NOT DETECTED. The SARS-CoV-2 RNA is generally detectable in upper and lower  respiratory specimens during the acute phase of infection. The lowest  concentration of SARS-CoV-2 viral copies this assay  can detect is 250  copies / mL. A negative result does not preclude SARS-CoV-2 infection  and should not be used as the sole basis for treatment or other  patient management decisions.  A negative result  may occur with  improper specimen collection / handling, submission of specimen other  than nasopharyngeal swab, presence of viral mutation(s) within the  areas targeted by this assay, and inadequate number of viral copies  (<250 copies / mL). A negative result must be combined with clinical  observations, patient history, and epidemiological information. If result is POSITIVE SARS-CoV-2 target nucleic acids are DETECTED. The SARS-CoV-2 RNA is generally detectable in upper and lower  respiratory specimens dur ing the acute phase of infection.  Positive  results are indicative of active infection with SARS-CoV-2.  Clinical  correlation with patient history and other diagnostic information is  necessary to determine patient infection status.  Positive results do  not rule out bacterial infection or co-infection with other viruses. If result is PRESUMPTIVE POSTIVE SARS-CoV-2 nucleic acids MAY BE PRESENT.   A presumptive positive result was obtained on the submitted specimen  and confirmed on repeat testing.  While 2019 novel coronavirus  (SARS-CoV-2) nucleic acids may be present in the submitted sample  additional confirmatory testing may be necessary for epidemiological  and / or clinical management purposes  to differentiate between  SARS-CoV-2 and other Sarbecovirus currently known to infect humans.  If clinically indicated additional testing with an alternate test  methodology [id number]) is advised. The SARS-CoV-2 RNA is generally  detectable in upper and lower respiratory sp ecimens during the acute  phase of infection. The expected result is Negative. Fact Sheet for Patients:  BoilerBrush.com.cyhttps://www.fda.gov/media/136312/download Fact Sheet for Healthcare  Providers: https://pope.com/https://www.fda.gov/media/136313/download This test is not yet approved or cleared by the [place] FDA and has been authorized for detection and/or diagnosis of SARS-CoV-2 by FDA under an Emergency Use Authorization (EUA).  This EUA will remain in effect (meaning this test can be used) for the duration of the COVID-19 declaration under Section 564(b)(1) of the Act, 21 U.S.C. section 360bbb-3(b)(1), unless the authorization is terminated or revoked sooner. Performed at [facility], [address] [place], [place] [address]   DIC panel     Status: Abnormal   Collection Time: 11/09/18 12:31 AM  Result Value Ref Range   Prothrombin Time 20.8 (H) 11.4 - 15.2 seconds   INR 1.8 (H) 0.8 - 1.2    Comment: (NOTE) INR goal varies based on device and disease states.    aPTT 42 (H) 24 - 36 seconds    Comment:        IF BASELINE aPTT IS ELEVATED, SUGGEST PATIENT RISK ASSESSMENT BE USED TO DETERMINE APPROPRIATE ANTICOAGULANT THERAPY.    Fibrinogen >800 (H) 210 - 475 mg/dL   D-Dimer, Quant 8.111.91 (H) 0.00 - 0.50 ug/mL-FEU    Comment: (NOTE) At the manufacturer cut-off of 0.50 ug/mL FEU, this assay has been documented to exclude PE with a sensitivity and negative predictive value of 97 to 99%.  At this time, this assay has not been approved by the FDA to exclude DVT/VTE. Results should be correlated with clinical presentation.    Platelets 91 (L) 150 - 400 K/uL    Comment: CONSISTENT WITH PREVIOUS RESULT   Smear Review NO SCHISTOCYTES SEEN     Comment: Performed at [facility], [address] [place], [place] [address]  Save Smear     Status: None   Collection Time: 11/09/18 12:31 AM  Result Value Ref Range   Smear Review SMEAR STAINED AND AVAILABLE FOR REVIEW     Comment: Performed at [facility], [address] [place], [place] [address]  Lactic acid, plasma  Status: Abnormal   Collection Time: 11/09/18  2:12 AM  Result Value Ref Range   Lactic Acid, Venous 4.1  (HH) 0.5 - 1.9 mmol/L    Comment: CRITICAL RESULT CALLED TO, READ BACK BY AND VERIFIED WITH: Kirke Corin  11/09/18 [facility] Performed at [facility], [address], [place], [place] [address]    Dg Chest Portable 1 View  Result Date: 11/09/2018 CLINICAL DATA:  53 year old male IJ line placement. EXAM: PORTABLE CHEST 1 VIEW COMPARISON:  11/08/2018 and earlier. FINDINGS: Portable AP upright view at 0407 hours. Right IJ central line placed, tip at the level of the carina, lower SVC. Incidental azygos fissure (normal variant). Stable cardiac size and mediastinal contours. No pneumothorax. Stable lung volumes and ventilation. IMPRESSION: Right IJ central line placed with tip at the lower SVC level and no adverse features. Electronically Signed   By: Odessa Fleming M.D.   On: 11/09/2018 04:41   Dg Chest Portable 1 View  Result Date: 11/09/2018 CLINICAL DATA:  Shortness of breath and fever EXAM: PORTABLE CHEST 1 VIEW COMPARISON:  11/06/2018 FINDINGS: The heart size and mediastinal contours are within normal limits. Both lungs are clear. The visualized skeletal structures are unremarkable. IMPRESSION: No active disease. Electronically Signed   By: Deatra Robinson M.D.   On: 11/09/2018 00:22    Pending Labs Unresulted Labs (From admission, onward)    Start     Ordered   11/09/18 0800  CBC with Differential  Daily,   R     11/09/18 0407   11/09/18 0800  Comprehensive metabolic panel  Daily,   R     11/09/18 0407   11/09/18 0500  Lactic acid, plasma  STAT Now then every 3 hours,   STAT     11/09/18 0407   11/09/18 0435  Hepatic function panel  Add-on,   R     11/09/18 0434   11/09/18 0402  Protime-INR  ONCE - STAT,   R     11/09/18 0407   11/09/18 0402  APTT  ONCE - STAT,   R     11/09/18 0407   11/09/18 0115  [id number] Activity  Once,   R     11/09/18 0116   11/09/18 0027  Blood Culture (routine x 2)  BLOOD CULTURE X 2,   STAT     11/09/18 0026   11/09/18 0011  Gastrointestinal Panel by PCR , Stool   (Gastrointestinal Panel by PCR, Stool)  Once,   R     11/09/18 0010          Vitals/Pain Today's Vitals   11/09/18 0415 11/09/18 0420 11/09/18 0430 11/09/18 0440  BP: (!) 73/34 (!) 72/48 (!) 68/53 (!) 87/61  Pulse: (!) 116 (!) 108 (!) 106 (!) 121  Resp: (!) 28 (!) 28 (!) 26 (!) 26  Temp:      TempSrc:      SpO2: 98% 98% 98% 99%  Weight:      Height:      PainSc:        Isolation Precautions Enteric precautions (UV disinfection)  Medications Medications  0.9 %  sodium chloride infusion (0 mLs Intravenous Stopped 11/09/18 0257)  norepinephrine (LEVOPHED)  in premix infusion (25 mcg/min Intravenous Rate/Dose Change 11/09/18 0432)  0.9 %  sodium chloride infusion (has no administration in time range)  ondansetron (ZOFRAN) tablet 4 mg (has no administration in time range)    Or  ondansetron (ZOFRAN) injection 4 mg (has no administration in  time range)  acetaminophen (TYLENOL) tablet 650 mg (has no administration in time range)    Or  acetaminophen (TYLENOL) suppository 650 mg (has no administration in time range)  sodium chloride 0.9 % bolus 1,000 mL (0 mLs Intravenous Stopped 11/09/18 0055)  sodium chloride 0.9 % bolus 1,000 mL (0 mLs Intravenous Stopped 11/09/18 0145)  metroNIDAZOLE (FLAGYL) IVPB 500 mg (0 mg Intravenous Stopped 11/09/18 0205)  cefTRIAXone (ROCEPHIN) 1 g in sodium chloride 0.9 % 100 mL IVPB (0 g Intravenous Stopped 11/09/18 0103)  sodium chloride 0.9 % bolus 1,000 mL (0 mLs Intravenous Stopped 11/09/18 0115)  hydrocortisone sodium succinate (SOLU-CORTEF) 100 MG injection 100 mg (100 mg Intravenous Given 11/09/18 0124)  acetaminophen (TYLENOL) tablet 650 mg (650 mg Oral Given 11/09/18 0239)  sodium chloride 0.9 % bolus 1,000 mL (1,000 mLs Intravenous New Bag/Given 11/09/18 0414)    Mobility walks Low fall risk   Focused Assessments    R Recommendations: See Admitting Provider Note  Report given to:   Additional Notes:

## 2018-11-09 NOTE — ED Notes (Signed)
Date and time results received: 11/09/18 0108 (use smartphrase ".now" to insert current time)  Test: Lactic Acid Critical Value: 4.6  Name of Provider Notified: Dr Clayborne Dana  Orders Received? Or Actions Taken?:

## 2018-11-09 NOTE — ED Notes (Signed)
Dr Ortiz at bedside 

## 2018-11-09 NOTE — H&P (Signed)
History and Physical    William Hill ZOX:[id number] DOB: 05/29/1966 DOA: 11/08/2018  PCP: [facility]   Patient coming from: Home.  I have personally briefly reviewed patient's old medical records in [facility]  Chief Complaint: Shortness of breath  HPI: William [name] a 53 y.o. male with medical history significant of CAD, history of MI, chronic systolic heart failure with an EF of 30 to 35%, hyperlipidemia, hypertension who is coming to the emergency department with complaints of a 3-day history of multiple episodes of diarrhea (5-6 daily for the past 3 days) associated with loose stools with a significant amount of mucus.  He denies abdominal pain, nausea, emesis, melena or hematochezia.  He states earlier in the day he started feeling dyspnea, fever, chills fatigue and malaise.  The patient was febrile on arrival to the emergency department.  He denies chest pain, palpitations, PND, orthopnea or pitting edema of the lower extremities.  He states that he has felt lightheaded, his earring has decreased in volume and is very dark in color.  He denies flank pain, dysuria, frequency or hematuria.  He denies polyuria, polydipsia, polyphagia or blurred vision.  ED Course: Initial vital signs temperature 98.1 F, pulse 116, blood pressure 66/41 mmHg, respiratory rate 18 and O2 sat 98% on room air.  The patient received 3000 mL of NS bolus, ceftriaxone 1 g IVPB and metronidazole 500 mg IVPB.  Dr. Clayborne Dana place a right IJ central line and started the patient on an infusion of Levophed.   His CBC showed a white count is 8.0, hemoglobin 14.3 g/dL and platelets 86.  PT was 20.8 and APTT 42 seconds.  INR was 1.8.  Fibrinogen was more than 800 mg/dL and d-dimer are 8.11 mcg/mL.  Urinalysis was amber in color and hazy in appearance, but otherwise within normal limits.  Troponin was normal.  BNP was 365.0 pg/mL.  Sodium is 128, potassium 3.9, chloride 93 and CO2 17 mmol/L.  Anion gap  was 18.  Glucose 209, BUN 58 and creatinine 5.37 mg/dL.  Renal function was normal 3 days ago.  Initial lactic acid 4.69 4.1 mmol/L.  COVID-19 swab was negative.  His chest radiograph does not show any acute cardiopulmonary disease.  Review of Systems: As per HPI otherwise 10 point review of systems negative.  Past Medical History:  Diagnosis Date  . CAD (coronary artery disease)    post anterior wall myocardial infarction in 1999 with PCI and stenting at [facility].  . Heart attack (HCC)   . History of stress test 12/2010   EF of 36% with apical scar  . Hx of echocardiogram 02/13/2012   EF 35%. There is apical dyskinesis  . Hx of echocardiogram 05/09/2012   difinity contrast that showed no apical mural thrombus with an EF of 30%-35%  . Hyperlipidemia   . Hypertension     Past Surgical History:  Procedure Laterality Date  . BACK SURGERY    . CORONARY ANGIOPLASTY       reports that he has never smoked. He has never used smokeless tobacco. He reports that he does not drink alcohol or use drugs.  Allergies  Allergen Reactions  . Aspirin     Cannot take due to being on blood thinner-per pt    Family History  Problem Relation Age of Onset  . Cancer Father   . Heart Problems Maternal Grandmother   . Stroke Maternal Grandfather   . Heart attack Paternal Grandfather  Prior to Admission medications   Medication Sig Start Date End Date Taking? Authorizing Provider  acetaminophen (TYLENOL) 325 MG tablet Take 2 tablets (650 mg total) by mouth every 6 (six) hours as needed for mild pain (or Fever >/= 101). 09/03/18   Emokpae, Courage, MD  aspirin 81 MG tablet Take 1 tablet (81 mg total) by mouth daily with breakfast. Patient not taking: Reported on 11/09/2018 09/03/18   Shon Hale, MD  atorvastatin (LIPITOR) 40 MG tablet TAKE 1 TABLET BY MOUTH AT BEDTIME 02/12/18   Runell Gess, MD  benazepril (LOTENSIN) 20 MG tablet TAKE 1 TABLET BY MOUTH EVERY DAY 02/12/18   Runell Gess, MD  carvedilol (COREG) 25 MG tablet Take 1 tablet (25 mg total) by mouth 2 (two) times daily. 11/27/17 08/31/18  Runell Gess, MD  fish oil-omega-3 fatty acids 1000 MG capsule Take 1 g by mouth 2 (two) times daily.    [provider]  Multiple Vitamin (MULTIVITAMIN WITH MINERALS) TABS tablet Take 1 tablet by mouth daily.    [provider]  niacin 500 MG tablet Take 500 mg by mouth daily with breakfast.    [provider]  XARELTO 20 MG TABS tablet TAKE ONE TABLET BY MOUTH ONCE DAILY WITH SUPPER 10/29/18   Runell Gess, MD    Physical Exam: Vitals:   11/09/18 0430 11/09/18 0440 11/09/18 0450 11/09/18 0500  BP: (!) 68/53 (!) 87/61  (!) 92/57  Pulse: (!) 106 (!) 121 (!) 116 (!) 110  Resp: (!) 26 (!) 26  (!) 33  Temp:      TempSrc:      SpO2: 98% 99%  99%  Weight:      Height:        Constitutional: Looks acutely ill. Eyes: PERRL, lids and conjunctivae mildly injected.  No icterus. ENMT: Mucous membranes are very dry. Posterior pharynx clear of any exudate or lesions. Neck: normal, supple, no masses, no thyromegaly Respiratory: clear to auscultation bilaterally, no wheezing, no crackles. Normal respiratory effort. No accessory muscle use.  Cardiovascular: Regular rate and rhythm, no murmurs / rubs / gallops. No extremity edema. 2+ pedal pulses. No carotid bruits.  Abdomen: Obese, soft, no tenderness, no masses palpated. No hepatosplenomegaly. Bowel sounds positive.  Musculoskeletal: no clubbing / cyanosis. Good ROM, no contractures. Normal muscle tone.  Skin: Multiple small operations on hands and forearms from cat scratches.  No erythema or lymphangitis seen. Neurologic: CN 2-12 grossly intact. Sensation intact, DTR normal. Strength 5/5 in all 4.  Psychiatric: Normal judgment and insight. Alert and oriented x 3. Normal mood.   Labs on Admission: I have personally reviewed following labs and imaging studies  CBC: Recent Labs  Lab 11/06/18  1115 11/08/18 2325 11/09/18 0031  WBC 11.0* 8.0  --   HGB 17.3* 14.3  --   HCT 48.3 39.4  --   MCV 96.2 95.2  --   PLT 129* 86* 91*   Basic Metabolic Panel: Recent Labs  Lab 11/06/18 1115 11/08/18 2325  NA 135 128*  K 3.6 3.9  CL 99 93*  CO2 24 17*  GLUCOSE 214* 209*  BUN 11 58*  CREATININE 1.16 5.37*  CALCIUM 9.0 8.1*   GFR: Estimated Creatinine Clearance: 19.5 mL/min (A) (by C-G formula based on SCr of 5.37 mg/dL (H)). Liver Function Tests: No results for input(s): AST, ALT, ALKPHOS, BILITOT, PROT, ALBUMIN in the last 168 hours. No results for input(s): LIPASE, AMYLASE in the last 168 hours.  No results for input(s): AMMONIA in the last 168 hours. Coagulation Profile: Recent Labs  Lab 11/09/18 0031  INR 1.8*   Cardiac Enzymes: Recent Labs  Lab 11/08/18 2325  TROPONINI <0.03   BNP (last 3 results) No results for input(s): PROBNP in the last 8760 hours. HbA1C: No results for input(s): HGBA1C in the last 72 hours. CBG: No results for input(s): GLUCAP in the last 168 hours. Lipid Profile: No results for input(s): CHOL, HDL, LDLCALC, TRIG, CHOLHDL, LDLDIRECT in the last 72 hours. Thyroid Function Tests: No results for input(s): TSH, T4TOTAL, FREET4, T3FREE, THYROIDAB in the last 72 hours. Anemia Panel: No results for input(s): VITAMINB12, FOLATE, FERRITIN, TIBC, IRON, RETICCTPCT in the last 72 hours. Urine analysis:    Component Value Date/Time   COLORURINE AMBER (A) 11/08/2018 2336   APPEARANCEUR HAZY (A) 11/08/2018 2336   LABSPEC 1.023 11/08/2018 2336   PHURINE 5.0 11/08/2018 2336   GLUCOSEU NEGATIVE 11/08/2018 2336   HGBUR NEGATIVE 11/08/2018 2336   BILIRUBINUR NEGATIVE 11/08/2018 2336   KETONESUR NEGATIVE 11/08/2018 2336   PROTEINUR NEGATIVE 11/08/2018 2336   NITRITE NEGATIVE 11/08/2018 2336   LEUKOCYTESUR NEGATIVE 11/08/2018 2336    Radiological Exams on Admission: Dg Chest Portable 1 View  Result Date: 11/09/2018 CLINICAL DATA:  53 year old  male IJ line placement. EXAM: PORTABLE CHEST 1 VIEW COMPARISON:  11/08/2018 and earlier. FINDINGS: Portable AP upright view at 0407 hours. Right IJ central line placed, tip at the level of the carina, lower SVC. Incidental azygos fissure (normal variant). Stable cardiac size and mediastinal contours. No pneumothorax. Stable lung volumes and ventilation. IMPRESSION: Right IJ central line placed with tip at the lower SVC level and no adverse features. Electronically Signed   By: Odessa Fleming M.D.   On: 11/09/2018 04:41   Dg Chest Portable 1 View  Result Date: 11/09/2018 CLINICAL DATA:  Shortness of breath and fever EXAM: PORTABLE CHEST 1 VIEW COMPARISON:  11/06/2018 FINDINGS: The heart size and mediastinal contours are within normal limits. Both lungs are clear. The visualized skeletal structures are unremarkable. IMPRESSION: No active disease. Electronically Signed   By: Deatra Robinson M.D.   On: 11/09/2018 00:22   Echo July/2019 ------------------------------------------------------------------- LV EF: 30% -   35%  ------------------------------------------------------------------- Indications:      Ischemic cardiomyopathy (I25.5).  ------------------------------------------------------------------- History:   PMH:   Atrial fibrillation.  Ischemic cardiomyopathy. Risk factors:  Hypertension. Dyslipidemia.  ------------------------------------------------------------------- Study Conclusions  - Procedure narrative: Transthoracic echocardiography. Image   quality was poor. The study was technically difficult, as a   result of poor sound wave transmission. Intravenous contrast   (Definity) was administered to opacify the LV. - Left ventricle: The cavity size was severely dilated. Systolic   function was moderately to severely reduced. The estimated   ejection fraction was in the range of 30% to 35%. There is   akinesis of the mid-apicalinferolateral and inferior myocardium.   There is akinesis  of the entireinferoseptal myocardium. There is   akinesis of the apicalanterior myocardium. There is akinesis of   the apical myocardium. The study was not technically sufficient   to allow evaluation of LV diastolic dysfunction due to atrial   fibrillation. - Left atrium: The atrium was mildly dilated. - Pulmonic valve: There was trivial regurgitation. - Pulmonary arteries: Systolic pressure could not be accurately   estimated.  Impressions:  - Severely dilated LV with moderate to severely reduced LVF with EF   30-35%. There is mid to apical akinesis of the  inferior/inferolateral walls, apical anterior, apical and   inferoseptal walls. No obvious LV thrombus by definity contrast   study but there is swirling of contrast in the LV apex concerning   for possible early forming thrombus. Consider long term   anticoagulation if clinically indicated.  EKG: Independently reviewed. Vent. rate 112 BPM PR interval * ms QRS duration 98 ms QT/QTc 332/435 ms P-R-T axes * -85 46 Atrial fibrillation Multiple ventricular premature complexes Minimal ST depression, diffuse leads  Assessment/Plan Principal Problem:   Septic shock due to undetermined organism (HCC) Due to severe gastroenteritis/enteritis with fever and significant dehydration. We will admit to the ICU/inpatient. Continue time-limited IV hydration for now. IV fluids will be reevaluated and adjusted later today. Cefepime per pharmacy. Continue metronidazole. Follow-up blood cultures and sensitivity. Continue titrated norepinephrine infusion as needed.  Active Problems:   Acute gastroenteritis As above. GI pathogen by PCR still pending.    AKI (acute kidney injury) (HCC) Secondary to dehydration due to GI losses. Continue time-limited normal saline at 125 mL/h x 8 hours. Monitor intake and output. Obtain urinalysis when possible. Follow-up renal function and electrolytes. Consider nephrology evaluation.    Lactic  acidosis Secondary to septic shock. IV fluids given. Follow-up act lactic acid level.    Hyperglycemia Check hemoglobin A1c given random level over 200.    Essential hypertension Hold antihypertensives for now. Monitor blood pressure.    Hyperlipidemia Hold atorvastatin.    Atrial fibrillation (HCC) CHA?DS?-VASc Score of at least 3.    CAD (coronary artery disease) Denies chest pain or palpitations. Beta-blocker, statin and anticoagulation have been held.    DVT prophylaxis: On Xarelto. Code Status: Full code. Family Communication: Disposition Plan: Admit for IV hydration, IV antibiotics, vasopressors and close monitoring Consults called: Admission status: Inpatient/ICU.   Bobette Moavid Manuel Brylan Dec MD [facility]  11/09/2018, 5:36 AM   This document was prepared using Dragon voice recognition software and may contain some unintended transcription errors.

## 2018-11-10 DIAGNOSIS — I482 Chronic atrial fibrillation, unspecified: Secondary | ICD-10-CM

## 2018-11-10 DIAGNOSIS — B955 Unspecified streptococcus as the cause of diseases classified elsewhere: Secondary | ICD-10-CM

## 2018-11-10 DIAGNOSIS — R739 Hyperglycemia, unspecified: Secondary | ICD-10-CM

## 2018-11-10 DIAGNOSIS — I10 Essential (primary) hypertension: Secondary | ICD-10-CM

## 2018-11-10 DIAGNOSIS — R7881 Bacteremia: Secondary | ICD-10-CM

## 2018-11-10 DIAGNOSIS — E785 Hyperlipidemia, unspecified: Secondary | ICD-10-CM

## 2018-11-10 LAB — CBC WITH DIFFERENTIAL/PLATELET
Abs Immature Granulocytes: 0.15 10*3/uL — ABNORMAL HIGH (ref 0.00–0.07)
Basophils Absolute: 0 10*3/uL (ref 0.0–0.1)
Basophils Relative: 0 %
Eosinophils Absolute: 0 10*3/uL (ref 0.0–0.5)
Eosinophils Relative: 0 %
HCT: 33.3 % — ABNORMAL LOW (ref 39.0–52.0)
Hemoglobin: 12.2 g/dL — ABNORMAL LOW (ref 13.0–17.0)
Immature Granulocytes: 1 %
Lymphocytes Relative: 4 %
Lymphs Abs: 0.5 10*3/uL — ABNORMAL LOW (ref 0.7–4.0)
MCH: 34.7 pg — ABNORMAL HIGH (ref 26.0–34.0)
MCHC: 36.6 g/dL — ABNORMAL HIGH (ref 30.0–36.0)
MCV: 94.6 fL (ref 80.0–100.0)
Monocytes Absolute: 0.5 10*3/uL (ref 0.1–1.0)
Monocytes Relative: 4 %
Neutro Abs: 9.8 10*3/uL — ABNORMAL HIGH (ref 1.7–7.7)
Neutrophils Relative %: 91 %
Platelets: 93 10*3/uL — ABNORMAL LOW (ref 150–400)
RBC: 3.52 MIL/uL — ABNORMAL LOW (ref 4.22–5.81)
RDW: 13.4 % (ref 11.5–15.5)
WBC: 10.9 10*3/uL — ABNORMAL HIGH (ref 4.0–10.5)
nRBC: 0 % (ref 0.0–0.2)

## 2018-11-10 LAB — COMPREHENSIVE METABOLIC PANEL
ALT: 83 U/L — ABNORMAL HIGH (ref 0–44)
AST: 99 U/L — ABNORMAL HIGH (ref 15–41)
Albumin: 2.6 g/dL — ABNORMAL LOW (ref 3.5–5.0)
Alkaline Phosphatase: 55 U/L (ref 38–126)
Anion gap: 13 (ref 5–15)
BUN: 47 mg/dL — ABNORMAL HIGH (ref 6–20)
CO2: 18 mmol/L — ABNORMAL LOW (ref 22–32)
Calcium: 8.2 mg/dL — ABNORMAL LOW (ref 8.9–10.3)
Chloride: 104 mmol/L (ref 98–111)
Creatinine, Ser: 1.46 mg/dL — ABNORMAL HIGH (ref 0.61–1.24)
GFR calc Af Amer: >60 mL/min (ref >60)
GFR calc non Af Amer: 55 mL/min — ABNORMAL LOW (ref >60)
Glucose, Bld: 204 mg/dL — ABNORMAL HIGH (ref 70–99)
Potassium: 3.1 mmol/L — ABNORMAL LOW (ref 3.5–5.1)
Sodium: 135 mmol/L (ref 135–145)
Total Bilirubin: 2.2 mg/dL — ABNORMAL HIGH (ref 0.3–1.2)
Total Protein: 5.9 g/dL — ABNORMAL LOW (ref 6.5–8.1)

## 2018-11-10 LAB — GASTROINTESTINAL PANEL BY PCR, STOOL (REPLACES STOOL CULTURE)

## 2018-11-10 LAB — ADAMTS13 ACTIVITY REFLEX

## 2018-11-10 LAB — ADAMTS13 ACTIVITY: Adamts 13 Activity: 30.6 % — ABNORMAL LOW (ref >66.8)

## 2018-11-10 MED ORDER — ATORVASTATIN CALCIUM 40 MG PO TABS
40.0000 mg | ORAL_TABLET | Freq: Every day | ORAL | Status: DC
  Administered 2018-11-10: 40 mg via ORAL
  Filled 2018-11-10: qty 1

## 2018-11-10 MED ORDER — SODIUM CHLORIDE 0.9 % IV SOLN: INTRAVENOUS | Status: DC

## 2018-11-10 MED ORDER — POTASSIUM CHLORIDE IN NACL 40-0.9 MEQ/L-% IV SOLN
INTRAVENOUS | Status: AC
  Administered 2018-11-10: 75 mL/h via INTRAVENOUS

## 2018-11-10 MED ORDER — SODIUM CHLORIDE 0.9 % IV SOLN
2.0000 g | INTRAVENOUS | Status: DC
  Administered 2018-11-10 – 2018-11-11 (×2): 2 g via INTRAVENOUS
  Filled 2018-11-10 (×2): qty 20

## 2018-11-10 MED ORDER — POTASSIUM CHLORIDE CRYS ER 20 MEQ PO TBCR
20.0000 meq | EXTENDED_RELEASE_TABLET | Freq: Once | ORAL | Status: AC
  Administered 2018-11-10: 10:00:00 20 meq via ORAL
  Filled 2018-11-10: qty 1

## 2018-11-10 MED ORDER — SACCHAROMYCES BOULARDII 250 MG PO CAPS
250.0000 mg | ORAL_CAPSULE | Freq: Two times a day (BID) | ORAL | Status: DC
  Administered 2018-11-10 – 2018-11-11 (×3): 250 mg via ORAL
  Filled 2018-11-10 (×3): qty 1

## 2018-11-10 MED ORDER — RIVAROXABAN 20 MG PO TABS
20.0000 mg | ORAL_TABLET | Freq: Every day | ORAL | Status: DC
  Administered 2018-11-10 – 2018-11-11 (×2): 20 mg via ORAL
  Filled 2018-11-10 (×2): qty 1

## 2018-11-10 NOTE — Plan of Care (Signed)
Patient is progressing with care plan. 

## 2018-11-10 NOTE — Progress Notes (Signed)
PROGRESS NOTE    William Hill  QTM:[id number] DOB: 1965/09/14 DOA: 11/08/2018 PCP: [facility]     Brief Narrative:  53 y.o. male with medical history significant of CAD, history of MI, chronic systolic heart failure with an EF of 30 to 35%, hyperlipidemia, hypertension who is coming to the emergency department with complaints of a 3-day history of multiple episodes of diarrhea (5-6 daily for the past 3 days) associated with loose stools with a significant amount of mucus.  He denies abdominal pain, nausea, emesis, melena or hematochezia.  He states earlier in the day he started feeling dyspnea, fever, chills fatigue and malaise.  The patient was febrile on arrival to the emergency department.  He denies chest pain, palpitations, PND, orthopnea or pitting edema of the lower extremities.  He states that he has felt lightheaded, his earring has decreased in volume and is very dark in color.  He denies flank pain, dysuria, frequency or hematuria.  He denies polyuria, polydipsia, polyphagia or blurred vision.  ED Course: Initial vital signs temperature 98.1 F, pulse 116, blood pressure 66/41 mmHg, respiratory rate 18 and O2 sat 98% on room air.  The patient received 3000 mL of NS bolus, ceftriaxone 1 g IVPB and metronidazole 500 mg IVPB.  Dr. Clayborne Dana place a right IJ central line and started the patient on an infusion of Levophed.   Assessment & Plan: 1-septic shock secondary to a Streptococcus pyogenes bacteremia -Patient with excellent response to fluid resuscitation, transient use of Levophed and IV antibiotics. -Case has been discussed with infectious disease doctor (Dr. Orvan Falconer), recommendations given to treat with ceftriaxone for 48 hours, repeat blood cultures and if continued to be stable pursued a total of 10 days treatment using amoxicillin at time of discharge. -At this moment no need for 2D echo or TEE to rule out endocarditis. -Continue supportive care  2-acute on  chronic renal failure -Patient with stage III renal failure at baseline -Creatinine peak on admission of 5.1 -Down to 1.4 after fluid resuscitation -Continue avoiding nephrotoxic agents -Continue IV fluids -Check BMET in a.m.  3-hyperglycemia  -No prior history of diabetes -Check A1c  4-diarrhea -Appears to be associated with gastroenteritis -Patient has been started on Florastor -GI panel negative. -No abdominal pain.  5-chronic systolic heart failure -Currently euvolemic -Continue holding beta blockers and diuretics -Follow daily weights and strict I's and O's -Heart healthy diet has been encouraged.  6-essential hypertension -Blood pressure currently stable -Given presentation of shock on presentation we will continue holding antihypertensive and diuretics for now.  7-history of atrial fibrillation -Has remained rate controlled -Will resume Xarelto for secondary prevention -Most likely restart beta-blocker in a.m.  8-hyperlipidemia -Will resume statins.   DVT prophylaxis: Xarelto Code Status: Full code Family Communication: Wife updated by phone. Disposition Plan: Remains inpatient, continue IV antibiotics as instructed by infectious disease specialist; continue IV fluids; repeat basic metabolic panel in a.m.  Repeat blood cultures.  Consultants:   ID (Dr. Orvan Falconer).  Procedures:   See below for x-ray reports.  Antimicrobials:  Anti-infectives (From admission, onward)   Start     Dose/Rate Route Frequency Ordered Stop   11/10/18 1245  cefTRIAXone (ROCEPHIN) 2 g in sodium chloride 0.9 % 100 mL IVPB     2 g 200 mL/hr over 30 Minutes Intravenous Every 24 hours 11/10/18 1244     11/10/18 0800  ceFEPIme (MAXIPIME) 2 g in sodium chloride 0.9 % 100 mL IVPB  Status:  Discontinued  2 g 200 mL/hr over 30 Minutes Intravenous Every 24 hours 11/09/18 0802 11/10/18 1244   11/09/18 0800  metroNIDAZOLE (FLAGYL) IVPB 500 mg  Status:  Discontinued     500 mg 100 mL/hr  over 60 Minutes Intravenous Every 8 hours 11/09/18 0631 11/10/18 1244   11/09/18 0645  ceFEPIme (MAXIPIME) 2 g in sodium chloride 0.9 % 100 mL IVPB     2 g 200 mL/hr over 30 Minutes Intravenous  Once 11/09/18 0631 11/09/18 0812   11/09/18 0030  metroNIDAZOLE (FLAGYL) IVPB 500 mg     500 mg 100 mL/hr over 60 Minutes Intravenous  Once 11/09/18 0025 11/09/18 0205   11/09/18 0030  cefTRIAXone (ROCEPHIN) 1 g in sodium chloride 0.9 % 100 mL IVPB     1 g 200 mL/hr over 30 Minutes Intravenous  Once 11/09/18 0025 11/09/18 0103       Subjective: Currently afebrile, denies chest pain, palpitations, shortness of breath, nausea or vomiting.  No abdominal pain.  Reports still having some loose stools but much improved.  Objective: Vitals:   11/09/18 2022 11/09/18 2158 11/10/18 0512 11/10/18 0756  BP:  117/62 109/68   Pulse: (!) 108 (!) 113 (!) 101   Resp: Temp:   98.4 F (36.9 C)   TempSrc:   Oral   SpO2: 98% 100% 100% 99%  Weight:   107.9 kg   Height:        Intake/Output Summary (Last 24 hours) at 11/10/2018 1258 Last data filed at 11/10/2018 0300 Gross per 24 hour  Intake 519.94 ml  Output 700 ml  Net -180.06 ml   Filed Weights   11/08/18 2138 11/09/18 0500 11/10/18 0512  Weight: 106 kg 109 kg 107.9 kg    Examination: General exam: Alert, awake, oriented x 3; in no acute distress.  Denies chest pain and palpitations. Respiratory system: Clear to auscultation. Respiratory effort normal. Cardiovascular system: Irregular, rate controlled, No murmurs, rubs, gallops. Gastrointestinal system: Abdomen is nondistended, soft and nontender. No organomegaly or masses felt. Normal bowel sounds heard. Central nervous system: Alert and oriented. No focal neurological deficits. Extremities: No C/C/E, +pedal pulses Skin: No rashes, lesions or ulcers Psychiatry: Judgement and insight appear normal. Mood & affect appropriate.   Data Reviewed: I have personally reviewed following labs  and imaging studies  CBC: Recent Labs  Lab 11/06/18 1115 11/08/18 2325 11/09/18 0031 11/09/18 0817 11/10/18 0557  WBC 11.0* 8.0  --  12.1* 10.9*  NEUTROABS  --   --   --  10.9* 9.8*  HGB 17.3* 14.3  --  12.9* 12.2*  HCT 48.3 39.4  --  36.1* 33.3*  MCV 96.2 95.2  --  95.5 94.6  PLT 129* 86* 91* 92* 93*   Basic Metabolic Panel: Recent Labs  Lab 11/06/18 1115 11/08/18 2325 11/09/18 0817 11/10/18 0557  NA 135 128* 129* 135  K 3.6 3.9 3.7 3.1*  CL 99 93* 100 104  CO2 24 17* 15* 18*  GLUCOSE 214* 209* 218* 204*  BUN 11 58* 63* 47*  CREATININE 1.16 5.37* 3.96* 1.46*  CALCIUM 9.0 8.1* 7.2* 8.2*   GFR: Estimated Creatinine Clearance: 73.1 mL/min (A) (by C-G formula based on SCr of 1.46 mg/dL (H)).   Liver Function Tests: Recent Labs  Lab 11/09/18 0212 11/09/18 0817 11/10/18 0557  AST 60* 62* 99*  ALT 53* 54* 83*  ALKPHOS 26* 25* 55  BILITOT 2.1* 2.2* 2.2*  PROT 6.6 6.0* 5.9*  ALBUMIN 3.2*  2.9* 2.6*   Coagulation Profile: Recent Labs  Lab 11/09/18 0031 11/09/18 0212  INR 1.8* 2.0*   Cardiac Enzymes: Recent Labs  Lab 11/08/18 2325  TROPONINI <0.03   CBG: Recent Labs  Lab 11/09/18 1628  GLUCAP 244*   Urine analysis:    Component Value Date/Time   COLORURINE AMBER (A) 11/08/2018 2336   APPEARANCEUR HAZY (A) 11/08/2018 2336   LABSPEC 1.023 11/08/2018 2336   PHURINE 5.0 11/08/2018 2336   GLUCOSEU NEGATIVE 11/08/2018 2336   HGBUR NEGATIVE 11/08/2018 2336   BILIRUBINUR NEGATIVE 11/08/2018 2336   KETONESUR NEGATIVE 11/08/2018 2336   PROTEINUR NEGATIVE 11/08/2018 2336   NITRITE NEGATIVE 11/08/2018 2336   LEUKOCYTESUR NEGATIVE 11/08/2018 2336    Recent Results (from the past 240 hour(s))  SARS Coronavirus 2 (CEPHEID- Performed in [facility] hospital lab), Hosp Order     Status: None   Collection Time: 11/06/18 10:38 AM  Result Value Ref Range Status   SARS Coronavirus 2 NEGATIVE NEGATIVE Final    Comment: (NOTE) If result is NEGATIVE SARS-CoV-2  target nucleic acids are NOT DETECTED. The SARS-CoV-2 RNA is generally detectable in upper and lower  respiratory specimens during the acute phase of infection. The lowest  concentration of SARS-CoV-2 viral copies this assay can detect is 250  copies / mL. A negative result does not preclude SARS-CoV-2 infection  and should not be used as the sole basis for treatment or other  patient management decisions.  A negative result may occur with  improper specimen collection / handling, submission of specimen other  than nasopharyngeal swab, presence of viral mutation(s) within the  areas targeted by this assay, and inadequate number of viral copies  (<250 copies / mL). A negative result must be combined with clinical  observations, patient history, and epidemiological information. If result is POSITIVE SARS-CoV-2 target nucleic acids are DETECTED. The SARS-CoV-2 RNA is generally detectable in upper and lower  respiratory specimens dur ing the acute phase of infection.  Positive  results are indicative of active infection with SARS-CoV-2.  Clinical  correlation with patient history and other diagnostic information is  necessary to determine patient infection status.  Positive results do  not rule out bacterial infection or co-infection with other viruses. If result is PRESUMPTIVE POSTIVE SARS-CoV-2 nucleic acids MAY BE PRESENT.   A presumptive positive result was obtained on the submitted specimen  and confirmed on repeat testing.  While 2019 novel coronavirus  (SARS-CoV-2) nucleic acids may be present in the submitted sample  additional confirmatory testing may be necessary for epidemiological  and / or clinical management purposes  to differentiate between  SARS-CoV-2 and other Sarbecovirus currently known to infect humans.  If clinically indicated additional testing with an alternate test  methodology [phone]) is advised. The SARS-CoV-2 RNA is generally  detectable in upper and lower  respiratory sp ecimens during the acute  phase of infection. The expected result is Negative. Fact Sheet for Patients:  BoilerBrush.com.cyhttps://www.fda.gov/media/136312/download Fact Sheet for Healthcare Providers: https://pope.com/https://www.fda.gov/media/136313/download This test is not yet approved or cleared by the [place] FDA and has been authorized for detection and/or diagnosis of SARS-CoV-2 by FDA under an Emergency Use Authorization (EUA).  This EUA will remain in effect (meaning this test can be used) for the duration of the COVID-19 declaration under Section 564(b)(1) of the Act, 21 U.S.C. section 360bbb-3(b)(1), unless the authorization is terminated or revoked sooner. Performed at [facility], [address] [place], [place] [id number]   SARS Coronavirus 2 (CEPHEID-  Performed in [facility] hospital lab), Hosp Order     Status: None   Collection Time: 11/09/18 12:31 AM  Result Value Ref Range Status   SARS Coronavirus 2 NEGATIVE NEGATIVE Final    Comment: (NOTE) If result is NEGATIVE SARS-CoV-2 target nucleic acids are NOT DETECTED. The SARS-CoV-2 RNA is generally detectable in upper and lower  respiratory specimens during the acute phase of infection. The lowest  concentration of SARS-CoV-2 viral copies this assay can detect is 250  copies / mL. A negative result does not preclude SARS-CoV-2 infection  and should not be used as the sole basis for treatment or other  patient management decisions.  A negative result may occur with  improper specimen collection / handling, submission of specimen other  than nasopharyngeal swab, presence of viral mutation(s) within the  areas targeted by this assay, and inadequate number of viral copies  (<250 copies / mL). A negative result must be combined with clinical  observations, patient history, and epidemiological information. If result is POSITIVE SARS-CoV-2 target nucleic acids are DETECTED. The SARS-CoV-2 RNA is generally detectable in upper and lower   respiratory specimens dur ing the acute phase of infection.  Positive  results are indicative of active infection with SARS-CoV-2.  Clinical  correlation with patient history and other diagnostic information is  necessary to determine patient infection status.  Positive results do  not rule out bacterial infection or co-infection with other viruses. If result is PRESUMPTIVE POSTIVE SARS-CoV-2 nucleic acids MAY BE PRESENT.   A presumptive positive result was obtained on the submitted specimen  and confirmed on repeat testing.  While 2019 novel coronavirus  (SARS-CoV-2) nucleic acids may be present in the submitted sample  additional confirmatory testing may be necessary for epidemiological  and / or clinical management purposes  to differentiate between  SARS-CoV-2 and other Sarbecovirus currently known to infect humans.  If clinically indicated additional testing with an alternate test  methodology [phone]) is advised. The SARS-CoV-2 RNA is generally  detectable in upper and lower respiratory sp ecimens during the acute  phase of infection. The expected result is Negative. Fact Sheet for Patients:  BoilerBrush.com.cy Fact Sheet for Healthcare Providers: https://pope.com/ This test is not yet approved or cleared by the [place] FDA and has been authorized for detection and/or diagnosis of SARS-CoV-2 by FDA under an Emergency Use Authorization (EUA).  This EUA will remain in effect (meaning this test can be used) for the duration of the COVID-19 declaration under Section 564(b)(1) of the Act, 21 U.S.C. section 360bbb-3(b)(1), unless the authorization is terminated or revoked sooner. Performed at [facility], [address], [place], [place] [address]   Blood Culture (routine x 2)     Status: Abnormal (Preliminary result)   Collection Time: 11/09/18 12:42 AM  Result Value Ref Range Status   Specimen Description   Final    BLOOD  LEFT ANTECUBITAL Performed at [facility], [address], [place], [place] [address]    Special Requests   Final    BOTTLES DRAWN AEROBIC AND ANAEROBIC Blood Culture adequate volume Performed at [facility], [address], [place], [place] [address]    Culture  Setup Time   Final    GRAM POSITIVE COCCI Gram Stain Report Called to,Read Back By and Verified With: PINNIX H. AT [id number] ON [id number] BY THOMPSON S. AEROBIC AND ANAEROBIC CRITICAL RESULT CALLED TO, READ BACK BY AND VERIFIED WITH: T HANDY RN 11/09/18 2053 JDW    Culture (A)  Final    STREPTOCOCCUS PYOGENES SUSCEPTIBILITIES TO FOLLOW Performed at [facility], [address] [place], [place] [address]    Report Status PENDING  Incomplete  Blood Culture ID Panel (Reflexed)     Status: Abnormal   Collection Time: 11/09/18 12:42 AM  Result Value Ref Range Status   Enterococcus species NOT DETECTED NOT DETECTED Final   Listeria monocytogenes NOT DETECTED NOT DETECTED Final   Staphylococcus species NOT DETECTED NOT DETECTED Final   Staphylococcus aureus (BCID) NOT DETECTED NOT DETECTED Final   Streptococcus species DETECTED (A) NOT DETECTED Final    Comment: CRITICAL RESULT CALLED TO, READ BACK BY AND VERIFIED WITH: T HANDY RN 11/09/18 2053 JDW    Streptococcus agalactiae NOT DETECTED NOT DETECTED Final   Streptococcus pneumoniae NOT DETECTED NOT DETECTED Final   Streptococcus pyogenes DETECTED (A) NOT DETECTED Final    Comment: CRITICAL RESULT CALLED TO, READ BACK BY AND VERIFIED WITH: T HANDY RN 11/09/18 2053 JDW    Acinetobacter baumannii NOT DETECTED NOT DETECTED Final   Enterobacteriaceae species NOT DETECTED NOT DETECTED Final   Enterobacter cloacae complex NOT DETECTED NOT DETECTED Final   Escherichia coli NOT DETECTED NOT DETECTED Final   Klebsiella oxytoca NOT DETECTED NOT DETECTED Final   Klebsiella pneumoniae NOT DETECTED NOT DETECTED Final   Proteus species NOT DETECTED NOT DETECTED Final   Serratia marcescens  NOT DETECTED NOT DETECTED Final   Haemophilus influenzae NOT DETECTED NOT DETECTED Final   Neisseria meningitidis NOT DETECTED NOT DETECTED Final   Pseudomonas aeruginosa NOT DETECTED NOT DETECTED Final   Candida albicans NOT DETECTED NOT DETECTED Final   Candida glabrata NOT DETECTED NOT DETECTED Final   Candida krusei NOT DETECTED NOT DETECTED Final   Candida parapsilosis NOT DETECTED NOT DETECTED Final   Candida tropicalis NOT DETECTED NOT DETECTED Final    Comment: Performed at [facility], [address] [place], [place] [address]  Blood Culture (routine x 2)     Status: None (Preliminary result)   Collection Time: 11/09/18 12:47 AM  Result Value Ref Range Status   Specimen Description   Final    BLOOD LEFT HAND Performed at [facility], [address] [place], [place] [address]    Special Requests   Final    AEROBIC BOTTLE ONLY Blood Culture results may not be optimal due to an inadequate volume of blood received in culture bottles Performed at [facility], [address], [place], [place] [address]    Culture  Setup Time   Final    AEROBIC BOTTLE ONLY GRAM POSITIVE COCCI CRITICAL VALUE NOTED.  VALUE IS CONSISTENT WITH PREVIOUSLY REPORTED AND CALLED VALUE. Performed at [facility], [address] [place], [place] [address]    Culture GRAM POSITIVE COCCI  Final   Report Status PENDING  Incomplete  Gastrointestinal Panel by PCR , Stool     Status: None   Collection Time: 11/09/18  3:27 AM  Result Value Ref Range Status   Campylobacter species NOT DETECTED NOT DETECTED Final   Plesimonas shigelloides NOT DETECTED NOT DETECTED Final   Salmonella species NOT DETECTED NOT DETECTED Final   Yersinia enterocolitica NOT DETECTED NOT DETECTED Final   Vibrio species NOT DETECTED NOT DETECTED Final   Vibrio cholerae NOT DETECTED NOT DETECTED Final   Enteroaggregative E coli (EAEC) NOT DETECTED NOT DETECTED Final   Enteropathogenic E coli (EPEC) NOT DETECTED NOT  DETECTED Final   Enterotoxigenic E coli (ETEC) NOT DETECTED  NOT DETECTED Final   Shiga like toxin producing E coli (STEC) NOT DETECTED NOT DETECTED Final   Shigella/Enteroinvasive E coli (EIEC) NOT DETECTED NOT DETECTED Final   Cryptosporidium NOT DETECTED NOT DETECTED Final   Cyclospora cayetanensis NOT DETECTED NOT DETECTED Final   Entamoeba histolytica NOT DETECTED NOT DETECTED Final   Giardia lamblia NOT DETECTED NOT DETECTED Final   Adenovirus F40/41 NOT DETECTED NOT DETECTED Final   Astrovirus NOT DETECTED NOT DETECTED Final   Norovirus GI/GII NOT DETECTED NOT DETECTED Final   Rotavirus A NOT DETECTED NOT DETECTED Final   Sapovirus (I, II, IV, and V) NOT DETECTED NOT DETECTED Final    Comment: Performed at [facility], [address] [place], [place] [address]  MRSA PCR Screening     Status: None   Collection Time: 11/09/18  5:26 AM  Result Value Ref Range Status   MRSA by PCR NEGATIVE NEGATIVE Final    Comment:        The GeneXpert MRSA Assay (FDA approved for NASAL specimens only), is one component of a comprehensive MRSA colonization surveillance program. It is not intended to diagnose MRSA infection nor to guide or monitor treatment for MRSA infections. Performed at [facility], [address], [place], [place] [address]      Radiology Studies: Dg Chest Portable 1 View  Result Date: 11/09/2018 CLINICAL DATA:  53 year old male IJ line placement. EXAM: PORTABLE CHEST 1 VIEW COMPARISON:  11/08/2018 and earlier. FINDINGS: Portable AP upright view at 0407 hours. Right IJ central line placed, tip at the level of the carina, lower SVC. Incidental azygos fissure (normal variant). Stable cardiac size and mediastinal contours. No pneumothorax. Stable lung volumes and ventilation. IMPRESSION: Right IJ central line placed with tip at the lower SVC level and no adverse features. Electronically Signed   By: Odessa Fleming M.D.   On: 11/09/2018 04:41   Dg Chest Portable 1 View   Result Date: 11/09/2018 CLINICAL DATA:  Shortness of breath and fever EXAM: PORTABLE CHEST 1 VIEW COMPARISON:  11/06/2018 FINDINGS: The heart size and mediastinal contours are within normal limits. Both lungs are clear. The visualized skeletal structures are unremarkable. IMPRESSION: No active disease. Electronically Signed   By: Deatra Robinson M.D.   On: 11/09/2018 00:22   Scheduled Meds: . saccharomyces boulardii  250 mg Oral BID   Continuous Infusions: . 0.9 % NaCl with KCl 40 mEq / L 75 mL/hr (11/10/18 0829)  . cefTRIAXone (ROCEPHIN)  IV       LOS: 1 day    Time spent: 35 minutes. Greater than 50% of this time was spent in direct contact with the patient, coordinating care and discussing relevant ongoing clinical issues, including discussion about ARF and septic shock presentation due to streptococcus bacteremia.    Vassie Loll, MD [facility] Pager [phone]   11/10/2018, 12:58 PM

## 2018-11-11 DIAGNOSIS — E1165 Type 2 diabetes mellitus with hyperglycemia: Secondary | ICD-10-CM

## 2018-11-11 DIAGNOSIS — A400 Sepsis due to streptococcus, group A: Principal | ICD-10-CM

## 2018-11-11 LAB — BASIC METABOLIC PANEL
Anion gap: 8 (ref 5–15)
BUN: 27 mg/dL — ABNORMAL HIGH (ref 6–20)
CO2: 20 mmol/L — ABNORMAL LOW (ref 22–32)
Calcium: 8.3 mg/dL — ABNORMAL LOW (ref 8.9–10.3)
Chloride: 109 mmol/L (ref 98–111)
Creatinine, Ser: 0.77 mg/dL (ref 0.61–1.24)
GFR calc Af Amer: >60 mL/min (ref >60)
GFR calc non Af Amer: >60 mL/min (ref >60)
Glucose, Bld: 250 mg/dL — ABNORMAL HIGH (ref 70–99)
Potassium: 3.1 mmol/L — ABNORMAL LOW (ref 3.5–5.1)
Sodium: 137 mmol/L (ref 135–145)

## 2018-11-11 LAB — HEMOGLOBIN A1C
Hgb A1c MFr Bld: 8.6 % — ABNORMAL HIGH (ref 4.8–5.6)
Mean Plasma Glucose: 200.12 mg/dL

## 2018-11-11 MED ORDER — SACCHAROMYCES BOULARDII 250 MG PO CAPS: 250.0000 mg | ORAL_CAPSULE | Freq: Two times a day (BID) | ORAL | 0 refills | Status: DC

## 2018-11-11 MED ORDER — AMOXICILLIN-POT CLAVULANATE 875-125 MG PO TABS: 1.0000 | ORAL_TABLET | Freq: Two times a day (BID) | ORAL | 0 refills | Status: AC

## 2018-11-11 MED ORDER — GLYBURIDE-METFORMIN 2.5-500 MG PO TABS: ORAL_TABLET | ORAL | 3 refills | Status: AC

## 2018-11-11 NOTE — Discharge Summary (Signed)
Physician Discharge Summary  William Hill:[id number] DOB: 1965-08-05 DOA: 11/08/2018  PCP: [facility]  Admit date: 11/08/2018 Discharge date: 11/11/2018  Time spent: 35 minutes  Recommendations for Outpatient Follow-up:  1. Repeat basic metabolic panel to follow electrolytes and renal function 2. Reassess blood pressure and adjust antihypertensive regimen as needed 3. Close follow-up the patient's CBGs and A1c with further adjustment to hypoglycemic regimen as required.   Discharge Diagnoses:  Principal Problem:   Septic shock due to undetermined organism [facility]) Active Problems:   Essential hypertension   Hyperlipidemia   Atrial fibrillation (HCC)   CAD (coronary artery disease)   AKI (acute kidney injury) (HCC)   Acute gastroenteritis   Hyperglycemia   Lactic acidosis   Sepsis with acute renal failure (HCC)   Chronic systolic CHF (congestive heart failure) (HCC)   Type 2 diabetes mellitus with hyperglycemia, without long-term current use of insulin (HCC)   Discharge Condition: Stable and improved.  Patient discharged home with instruction to follow-up with PCP in 10 days.  Diet recommendation: Heart healthy and modified carbohydrate diet.  Filed Weights   11/08/18 2138 11/09/18 0500 11/10/18 0512  Weight: 106 kg 109 kg 107.9 kg    History of present illness:  53 y.o.malewith medical history significant ofCAD, history of MI, chronic systolic heart failure with an EF of 30 to 35%, hyperlipidemia, hypertension who is coming to the emergency department with complaints of a 3-dayhistory of multiple episodes of diarrhea (5-6 daily for the past 3 days) associated with loose stools with a significant amount of mucus. He denies abdominal pain, nausea, emesis, melena or hematochezia. He states earlier in the day he started feeling dyspnea, fever, chills fatigue and malaise. The patient was febrile on arrival to the emergency department. He denies chest  pain, palpitations, PND, orthopnea or pitting edema of the lower extremities. He states that he has felt lightheaded,his earring has decreased in volume and is very dark in color. He denies flank pain, dysuria, frequency or hematuria. He denies polyuria, polydipsia, polyphagia or blurred vision.  ED Course:Initial vital signs temperature 98.1 F, pulse 116, blood pressure 66/41 mmHg, respiratory rate 18 and O2 sat 98% on room air. The patient received 3000 mL of NS bolus, ceftriaxone 1 g IVPB and metronidazole 500 mg IVPB. Dr. Clayborne Dana place a right IJ central line and started the patient on an infusion of Levophed.   Hospital Course:  1-septic shock secondary to a Streptococcus pyogenes bacteremia -Patient with excellent response to fluid resuscitation, transient use of Levophed and IV antibiotics. -Case has been discussed with infectious disease doctor (Dr. Orvan Falconer), recommendations given to treat with ceftriaxone for 48 hours, repeat blood cultures and if continued to be stable pursuit a total of 10 days treatment using amoxicillin. -At this moment no need for 2D echo or TEE to rule out endocarditis. -Continue supportive care  2-acute on chronic renal failure -Patient with stage III renal failure at baseline -Creatinine peak on admission of 5.1 -Down to 0.77 at time of discharge after fluid resuscitation -Continue minimizing nephrotoxic agents -Advised to maintain adequate hydration -Repeat BMET  at follow-up visit to assure renal function and stability.    3-type 2 diabetes with hyperglycemia  -No prior history of diabetes -A1c 8.6 -patient advise to follow low carb diet  -started on Glucovance. -Needs close follow-up by PCP to further adjust hypoglycemic regimen as needed.  4-diarrhea -Appears to be associated with gastroenteritis -Patient has been started on Florastor -GI panel negative. -  No abdominal pain, no nausea, no vomiting.  5-chronic systolic heart  failure -Currently euvolemic and compensated -Resume home beta-blocker, ACE inhibitor and diuretics. -Advised to check weight on daily basis. -Continue patient follow-up with cardiology service. -Last ejection fraction 35%. -Heart healthy diet has been encouraged.  6-essential hypertension -Blood pressure currently stable and rising -Given presentation of shock antihypertensive agents were discontinued on admission. -At this moment is safe and to resume home antihypertensive regimen -Patient has been encouraged to follow low-sodium diet.  7-history of atrial fibrillation -Will resume Xarelto for secondary prevention -Continue the use of beta-blocker for rate control.  8-hyperlipidemia -Will resume statins.  Procedures:  See below for x-ray reports.  Consultations:  None  Discharge Exam: Vitals:   11/11/18 0542 11/11/18 0730  BP: 120/69   Pulse: 89   Resp: 16   Temp: 98.3 F (36.8 C)   SpO2: 100% 99%   General exam: Alert, awake, oriented x 3; in no acute distress.  Denies chest pain and palpitations. Respiratory system: Clear to auscultation. Respiratory effort normal. Cardiovascular system: Irregular, rate controlled, No murmurs, rubs, gallops. Gastrointestinal system: Abdomen is nondistended, soft and nontender. No organomegaly or masses felt. Normal bowel sounds heard. Central nervous system: Alert and oriented. No focal neurological deficits. Extremities: No C/C/E, +pedal pulses Skin: No rashes, lesions or ulcers Psychiatry: Judgement and insight appear normal. Mood & affect appropriate.   Discharge Instructions   Discharge Instructions    (HEART FAILURE PATIENTS) Call MD:  Anytime you have any of the following symptoms: 1) 3 pound weight gain in 24 hours or 5 pounds in 1 week 2) shortness of breath, with or without a dry hacking cough 3) swelling in the hands, feet or stomach 4) if you have to sleep on extra pillows at night in order to breathe.   Complete  by:  As directed    Diet - low sodium heart healthy   Complete by:  As directed    Discharge instructions   Complete by:  As directed    Take medications as prescribed Follow heart healthy and modified carbohydrate diet Follow-up with PCP in 10 days Maintain adequate hydration (as discussed however your weight in ounces of water daily) Check your weight on daily basis Increase activity slowly while listening to your body. Not to use Tylenol as needed for pain management and as an antipyretic.   Increase activity slowly   Complete by:  As directed      Allergies as of 11/11/2018      Reactions   Aspirin    Cannot take due to being on blood thinner-per pt      Medication List    TAKE these medications   acetaminophen 325 MG tablet Commonly known as:  TYLENOL Take 2 tablets (650 mg total) by mouth every 6 (six) hours as needed for mild pain (or Fever >/= 101).   amoxicillin-clavulanate 875-125 MG tablet Commonly known as:  AUGMENTIN Take 1 tablet by mouth 2 (two) times daily for 8 days.   aspirin 81 MG tablet Take 1 tablet (81 mg total) by mouth daily with breakfast.   atorvastatin 40 MG tablet Commonly known as:  LIPITOR TAKE 1 TABLET BY MOUTH AT BEDTIME   benazepril 20 MG tablet Commonly known as:  LOTENSIN TAKE 1 TABLET BY MOUTH EVERY DAY   carvedilol 25 MG tablet Commonly known as:  COREG Take 1 tablet (25 mg total) by mouth 2 (two) times daily.   fish oil-omega-3 fatty acids 1000  MG capsule Take 1 g by mouth 2 (two) times daily.   glyBURIDE-metformin 2.5-500 MG tablet Commonly known as:  Glucovance Take half tablet by mouth twice a day for 2 weeks; then increase to full tablet by mouth twice a day.   multivitamin with minerals Tabs tablet Take 1 tablet by mouth daily.   niacin 500 MG tablet Take 500 mg by mouth daily with breakfast.   saccharomyces boulardii 250 MG capsule Commonly known as:  FLORASTOR Take 1 capsule (250 mg total) by mouth 2 (two)  times daily.   Xarelto 20 MG Tabs tablet Generic drug:  rivaroxaban TAKE ONE TABLET BY MOUTH ONCE DAILY WITH SUPPER      Allergies  Allergen Reactions  . Aspirin     Cannot take due to being on blood thinner-per pt   Follow-up Information    [facility] [employer] [facility]. Schedule an appointment as soon as possible for a visit in 2 week(s).   Specialty:  Family Medicine Contact information: [address] [place] [address] [phone]            The results of significant diagnostics from this hospitalization (including imaging, microbiology, ancillary and laboratory) are listed below for reference.    Significant Diagnostic Studies: Dg Chest Portable 1 View  Result Date: 11/09/2018 CLINICAL DATA:  53 year old male IJ line placement. EXAM: PORTABLE CHEST 1 VIEW COMPARISON:  11/08/2018 and earlier. FINDINGS: Portable AP upright view at 0407 hours. Right IJ central line placed, tip at the level of the carina, lower SVC. Incidental azygos fissure (normal variant). Stable cardiac size and mediastinal contours. No pneumothorax. Stable lung volumes and ventilation. IMPRESSION: Right IJ central line placed with tip at the lower SVC level and no adverse features. Electronically Signed   By: Odessa Fleming M.D.   On: 11/09/2018 04:41   Dg Chest Portable 1 View  Result Date: 11/09/2018 CLINICAL DATA:  Shortness of breath and fever EXAM: PORTABLE CHEST 1 VIEW COMPARISON:  11/06/2018 FINDINGS: The heart size and mediastinal contours are within normal limits. Both lungs are clear. The visualized skeletal structures are unremarkable. IMPRESSION: No active disease. Electronically Signed   By: Deatra Robinson M.D.   On: 11/09/2018 00:22   Dg Chest Portable 1 View  Result Date: 11/06/2018 CLINICAL DATA:  Fever and muscle aches.  Cough. EXAM: PORTABLE CHEST 1 VIEW COMPARISON:  None. FINDINGS: The heart size and mediastinal contours are within normal limits. Both lungs are clear.  Incidental note is made of an azygos fissure. The visualized skeletal structures are unremarkable. IMPRESSION: No active disease. Electronically Signed   By: Obie Dredge M.D.   On: 11/06/2018 11:03    Microbiology: Recent Results (from the past 240 hour(s))  SARS Coronavirus 2 (CEPHEID- Performed in [facility] hospital lab), Hosp Order     Status: None   Collection Time: 11/06/18 10:38 AM  Result Value Ref Range Status   SARS Coronavirus 2 NEGATIVE NEGATIVE Final    Comment: (NOTE) If result is NEGATIVE SARS-CoV-2 target nucleic acids are NOT DETECTED. The SARS-CoV-2 RNA is generally detectable in upper and lower  respiratory specimens during the acute phase of infection. The lowest  concentration of SARS-CoV-2 viral copies this assay can detect is 250  copies / mL. A negative result does not preclude SARS-CoV-2 infection  and should not be used as the sole basis for treatment or other  patient management decisions.  A negative result may occur with  improper specimen collection / handling,  submission of specimen other  than nasopharyngeal swab, presence of viral mutation(s) within the  areas targeted by this assay, and inadequate number of viral copies  (<250 copies / mL). A negative result must be combined with clinical  observations, patient history, and epidemiological information. If result is POSITIVE SARS-CoV-2 target nucleic acids are DETECTED. The SARS-CoV-2 RNA is generally detectable in upper and lower  respiratory specimens dur ing the acute phase of infection.  Positive  results are indicative of active infection with SARS-CoV-2.  Clinical  correlation with patient history and other diagnostic information is  necessary to determine patient infection status.  Positive results do  not rule out bacterial infection or co-infection with other viruses. If result is PRESUMPTIVE POSTIVE SARS-CoV-2 nucleic acids MAY BE PRESENT.   A presumptive positive result was obtained  on the submitted specimen  and confirmed on repeat testing.  While 2019 novel coronavirus  (SARS-CoV-2) nucleic acids may be present in the submitted sample  additional confirmatory testing may be necessary for epidemiological  and / or clinical management purposes  to differentiate between  SARS-CoV-2 and other Sarbecovirus currently known to infect humans.  If clinically indicated additional testing with an alternate test  methodology [phone]) is advised. The SARS-CoV-2 RNA is generally  detectable in upper and lower respiratory sp ecimens during the acute  phase of infection. The expected result is Negative. Fact Sheet for Patients:  BoilerBrush.com.cy Fact Sheet for Healthcare Providers: https://pope.com/ This test is not yet approved or cleared by the [place] FDA and has been authorized for detection and/or diagnosis of SARS-CoV-2 by FDA under an Emergency Use Authorization (EUA).  This EUA will remain in effect (meaning this test can be used) for the duration of the COVID-19 declaration under Section 564(b)(1) of the Act, 21 U.S.C. section 360bbb-3(b)(1), unless the authorization is terminated or revoked sooner. Performed at [facility] [address], [place], [place] [address]   SARS Coronavirus 2 (CEPHEID- Performed in [facility] hospital lab), Hosp Order     Status: None   Collection Time: 11/09/18 12:31 AM  Result Value Ref Range Status   SARS Coronavirus 2 NEGATIVE NEGATIVE Final    Comment: (NOTE) If result is NEGATIVE SARS-CoV-2 target nucleic acids are NOT DETECTED. The SARS-CoV-2 RNA is generally detectable in upper and lower  respiratory specimens during the acute phase of infection. The lowest  concentration of SARS-CoV-2 viral copies this assay can detect is 250  copies / mL. A negative result does not preclude SARS-CoV-2 infection  and should not be used as the sole basis for treatment or other  patient  management decisions.  A negative result may occur with  improper specimen collection / handling, submission of specimen other  than nasopharyngeal swab, presence of viral mutation(s) within the  areas targeted by this assay, and inadequate number of viral copies  (<250 copies / mL). A negative result must be combined with clinical  observations, patient history, and epidemiological information. If result is POSITIVE SARS-CoV-2 target nucleic acids are DETECTED. The SARS-CoV-2 RNA is generally detectable in upper and lower  respiratory specimens dur ing the acute phase of infection.  Positive  results are indicative of active infection with SARS-CoV-2.  Clinical  correlation with patient history and other diagnostic information is  necessary to determine patient infection status.  Positive results do  not rule out bacterial infection or co-infection with other viruses. If result is PRESUMPTIVE POSTIVE SARS-CoV-2 nucleic acids MAY BE PRESENT.   A presumptive  positive result was obtained on the submitted specimen  and confirmed on repeat testing.  While 2019 novel coronavirus  (SARS-CoV-2) nucleic acids may be present in the submitted sample  additional confirmatory testing may be necessary for epidemiological  and / or clinical management purposes  to differentiate between  SARS-CoV-2 and other Sarbecovirus currently known to infect humans.  If clinically indicated additional testing with an alternate test  methodology [phone]) is advised. The SARS-CoV-2 RNA is generally  detectable in upper and lower respiratory sp ecimens during the acute  phase of infection. The expected result is Negative. Fact Sheet for Patients:  BoilerBrush.com.cy Fact Sheet for Healthcare Providers: https://pope.com/ This test is not yet approved or cleared by the [place] FDA and has been authorized for detection and/or diagnosis of SARS-CoV-2 by FDA under  an Emergency Use Authorization (EUA).  This EUA will remain in effect (meaning this test can be used) for the duration of the COVID-19 declaration under Section 564(b)(1) of the Act, 21 U.S.C. section 360bbb-3(b)(1), unless the authorization is terminated or revoked sooner. Performed at [facility], [address] [place], [place] [address]   Blood Culture (routine x 2)     Status: Abnormal (Preliminary result)   Collection Time: 11/09/18 12:42 AM  Result Value Ref Range Status   Specimen Description   Final    BLOOD LEFT ANTECUBITAL Performed at [facility], [address], [place], [place] [address]    Special Requests   Final    BOTTLES DRAWN AEROBIC AND ANAEROBIC Blood Culture adequate volume Performed at [facility], [address] [place], [place] [address]    Culture  Setup Time   Final    GRAM POSITIVE COCCI Gram Stain Report Called to,Read Back By and Verified With: PINNIX H. AT 1444 ON [id number] BY THOMPSON S. AEROBIC AND ANAEROBIC CRITICAL RESULT CALLED TO, READ BACK BY AND VERIFIED WITH: T HANDY RN 11/09/18 2053 JDW Performed at [facility], [address], [place], [place] [address]    Culture STREPTOCOCCUS PYOGENES (A)  Final   Report Status PENDING  Incomplete   Organism ID, Bacteria STREPTOCOCCUS PYOGENES  Final      Susceptibility   Streptococcus pyogenes - MIC*    PENICILLIN <=0.06 SENSITIVE Sensitive     CEFTRIAXONE <=0.12 SENSITIVE Sensitive     ERYTHROMYCIN <=0.12 SENSITIVE Sensitive     LEVOFLOXACIN 0.5 SENSITIVE Sensitive     VANCOMYCIN 0.5 SENSITIVE Sensitive     * STREPTOCOCCUS PYOGENES  Blood Culture ID Panel (Reflexed)     Status: Abnormal   Collection Time: 11/09/18 12:42 AM  Result Value Ref Range Status   Enterococcus species NOT DETECTED NOT DETECTED Final   Listeria monocytogenes NOT DETECTED NOT DETECTED Final   Staphylococcus species NOT DETECTED NOT DETECTED Final   Staphylococcus aureus (BCID) NOT DETECTED NOT DETECTED Final    Streptococcus species DETECTED (A) NOT DETECTED Final    Comment: CRITICAL RESULT CALLED TO, READ BACK BY AND VERIFIED WITH: T HANDY RN 11/09/18 2053 JDW    Streptococcus agalactiae NOT DETECTED NOT DETECTED Final   Streptococcus pneumoniae NOT DETECTED NOT DETECTED Final   Streptococcus pyogenes DETECTED (A) NOT DETECTED Final    Comment: CRITICAL RESULT CALLED TO, READ BACK BY AND VERIFIED WITH: T HANDY RN 11/09/18 2053 JDW    Acinetobacter baumannii NOT DETECTED NOT DETECTED Final   Enterobacteriaceae species NOT DETECTED NOT DETECTED Final   Enterobacter cloacae complex NOT DETECTED NOT DETECTED Final   Escherichia coli  NOT DETECTED NOT DETECTED Final   Klebsiella oxytoca NOT DETECTED NOT DETECTED Final   Klebsiella pneumoniae NOT DETECTED NOT DETECTED Final   Proteus species NOT DETECTED NOT DETECTED Final   Serratia marcescens NOT DETECTED NOT DETECTED Final   Haemophilus influenzae NOT DETECTED NOT DETECTED Final   Neisseria meningitidis NOT DETECTED NOT DETECTED Final   Pseudomonas aeruginosa NOT DETECTED NOT DETECTED Final   Candida albicans NOT DETECTED NOT DETECTED Final   Candida glabrata NOT DETECTED NOT DETECTED Final   Candida krusei NOT DETECTED NOT DETECTED Final   Candida parapsilosis NOT DETECTED NOT DETECTED Final   Candida tropicalis NOT DETECTED NOT DETECTED Final    Comment: Performed at [facility], [address] [place], [place] [address]  Blood Culture (routine x 2)     Status: Abnormal (Preliminary result)   Collection Time: 11/09/18 12:47 AM  Result Value Ref Range Status   Specimen Description   Final    BLOOD LEFT HAND Performed at [facility], [address] [place], [place] [address]    Special Requests   Final    AEROBIC BOTTLE ONLY Blood Culture results may not be optimal due to an inadequate volume of blood received in culture bottles Performed at [facility], [address] [place], [place] [address]    Culture  Setup Time   Final     AEROBIC BOTTLE ONLY GRAM POSITIVE COCCI CRITICAL VALUE NOTED.  VALUE IS CONSISTENT WITH PREVIOUSLY REPORTED AND CALLED VALUE.    Culture (A)  Final    STREPTOCOCCUS PYOGENES SUSCEPTIBILITIES PERFORMED ON PREVIOUS CULTURE WITHIN THE LAST 5 DAYS. Performed at [facility], [address] [place], [place] [address]    Report Status PENDING  Incomplete  Gastrointestinal Panel by PCR , Stool     Status: None   Collection Time: 11/09/18  3:27 AM  Result Value Ref Range Status   Campylobacter species NOT DETECTED NOT DETECTED Final   Plesimonas shigelloides NOT DETECTED NOT DETECTED Final   Salmonella species NOT DETECTED NOT DETECTED Final   Yersinia enterocolitica NOT DETECTED NOT DETECTED Final   Vibrio species NOT DETECTED NOT DETECTED Final   Vibrio cholerae NOT DETECTED NOT DETECTED Final   Enteroaggregative E coli (EAEC) NOT DETECTED NOT DETECTED Final   Enteropathogenic E coli (EPEC) NOT DETECTED NOT DETECTED Final   Enterotoxigenic E coli (ETEC) NOT DETECTED NOT DETECTED Final   Shiga like toxin producing E coli (STEC) NOT DETECTED NOT DETECTED Final   Shigella/Enteroinvasive E coli (EIEC) NOT DETECTED NOT DETECTED Final   Cryptosporidium NOT DETECTED NOT DETECTED Final   Cyclospora cayetanensis NOT DETECTED NOT DETECTED Final   Entamoeba histolytica NOT DETECTED NOT DETECTED Final   Giardia lamblia NOT DETECTED NOT DETECTED Final   Adenovirus F40/41 NOT DETECTED NOT DETECTED Final   Astrovirus NOT DETECTED NOT DETECTED Final   Norovirus GI/GII NOT DETECTED NOT DETECTED Final   Rotavirus A NOT DETECTED NOT DETECTED Final   Sapovirus (I, II, IV, and V) NOT DETECTED NOT DETECTED Final    Comment: Performed at [facility], [address] [place], [place] [address]  MRSA PCR Screening     Status: None   Collection Time: 11/09/18  5:26 AM  Result Value Ref Range Status   MRSA by PCR NEGATIVE NEGATIVE Final    Comment:        The GeneXpert MRSA Assay  (FDA approved for NASAL specimens only), is one component of a comprehensive MRSA colonization  surveillance program. It is not intended to diagnose MRSA infection nor to guide or monitor treatment for MRSA infections. Performed at [facility], [address], [place], [place] [address]   Culture, blood (Routine X 2) w Reflex to ID Panel     Status: None (Preliminary result)   Collection Time: 11/10/18  1:46 PM  Result Value Ref Range Status   Specimen Description   Final    LEFT ANTECUBITAL BOTTLES DRAWN AEROBIC AND ANAEROBIC   Special Requests Blood Culture adequate volume  Final   Culture   Final    NO GROWTH < 24 HOURS Performed at [facility], [address] [place], [place] [address]    Report Status PENDING  Incomplete  Culture, blood (Routine X 2) w Reflex to ID Panel     Status: None (Preliminary result)   Collection Time: 11/10/18  1:46 PM  Result Value Ref Range Status   Specimen Description BLOOD LEFT ARM BOTTLES DRAWN AEROBIC AND ANAEROBIC  Final   Special Requests Blood Culture adequate volume  Final   Culture   Final    NO GROWTH < 24 HOURS Performed at [facility], [address], [place], [place] [address]    Report Status PENDING  Incomplete     Labs: Basic Metabolic Panel: Recent Labs  Lab 11/06/18 1115 11/08/18 2325 11/09/18 0817 11/10/18 0557 11/11/18 0438  NA 135 128* 129* 135 137  K 3.6 3.9 3.7 3.1* 3.1*  CL 99 93* 100 104 109  CO2 24 17* 15* 18* 20*  GLUCOSE 214* 209* 218* 204* 250*  BUN 11 58* 63* 47* 27*  CREATININE 1.16 5.37* 3.96* 1.46* 0.77  CALCIUM 9.0 8.1* 7.2* 8.2* 8.3*   Liver Function Tests: Recent Labs  Lab 11/09/18 0212 11/09/18 0817 11/10/18 0557  AST 60* 62* 99*  ALT 53* 54* 83*  ALKPHOS 26* 25* 55  BILITOT 2.1* 2.2* 2.2*  PROT 6.6 6.0* 5.9*  ALBUMIN 3.2* 2.9* 2.6*   CBC: Recent Labs  Lab 11/06/18 1115 11/08/18 2325 11/09/18 0031 11/09/18 0817 11/10/18 0557  WBC 11.0* 8.0  --  12.1* 10.9*  NEUTROABS  --    --   --  10.9* 9.8*  HGB 17.3* 14.3  --  12.9* 12.2*  HCT 48.3 39.4  --  36.1* 33.3*  MCV 96.2 95.2  --  95.5 94.6  PLT 129* 86* 91* 92* 93*   Cardiac Enzymes: Recent Labs  Lab 11/08/18 2325  TROPONINI <0.03   BNP (last 3 results) Recent Labs    11/08/18 2325  BNP 365.0*   CBG: Recent Labs  Lab 11/09/18 1628  GLUCAP 244*   Signed:  Vassie Loll MD.  [facility] 11/11/2018, 1:58 PM

## 2018-11-11 NOTE — Discharge Instructions (Signed)
Antibiotic Medicine, Adult ° °Antibiotic medicines treat infections caused by a type of germ called bacteria. They work by killing the bacteria that make you sick. °When do I need to take antibiotics? °You often need these medicines to treat bacterial infections, such as: °· A urinary tract infection (UTI). °· Strep throat. °· Meningitis. This affects the spinal cord and brain. °· A bad lung infection. °You may start the medicines while your doctor waits for tests to come back. When the tests come back, your doctor may change or stop your medicine. °When are antibiotics not needed? °You do not need these medicines for most common illnesses, such as: °· A cold. °· The flu. °· A sore throat. °Antibiotics are not always needed for all infections caused by bacteria. Do not ask for these medicines, or take them, when they are not needed. °What are the risks of taking antibiotics? °Most antibiotics can cause an infection called Clostridioides difficile (C. diff).This causes watery poop (diarrhea). Let your doctor know right away if: °· You have watery poop while taking an antibiotic. °· You have watery poop after you stop taking an antibiotic. The illness can happen weeks after you stop the medicine. °You also have a risk of getting an infection in the future that antibiotics cannot treat (antibiotic-resistant infection). This type of infection can be dangerous. °What else should I know about taking antibiotics? ° °· You need to take the entire prescription. °? Take the medicine for as long as told by your doctor. °? Do not stop taking it even if you start to feel better. °· Try not to miss any doses. If you miss a dose, call your doctor. °· Birth control pills may not work. If you take birth control pills: °? Keep on taking them. °? Use a second form of birth control, such as a condom. Do this for as long as told by your doctor. °· Ask your doctor: °? How long to wait in between doses. °? If you should take the medicine  with food. °? If there is anything you should stay away from while taking the antibiotic, such as: °? Food. °? Drinks. °? Medicines. °? If there are any side effects you should watch for. °· Only take the medicines that your doctor told you to take. Do not take medicines that were given to someone else. °· Drink a large glass of water with the medicine. °· Ask the pharmacist for a tool to measure the medicine, such as: °? A syringe. °? A cup. °? A spoon. °· Throw away any extra medicine. °Contact a doctor if: °· You get worse. °· You have new joint pain or muscle aches after starting the medicine. °· You have side effects from the medicine, such as: °? Stomach pain. °? Watery poop. °? Feeling sick to your stomach (nausea). °Get help right away if: °· You have signs of a very bad allergic reaction. If this happens, stop taking the medicine right away. Signs may include: °? Hives. These are raised, itchy, red bumps on the skin. °? Skin rash. °? Trouble breathing. °? Wheezing. °? Swelling. °? Feeling dizzy. °? Throwing up (vomiting). °· Your pee (urine) is dark, or is the color of blood. °· Your skin turns yellow. °· You bruise easily. °· You bleed easily. °· You have very bad watery poop and cramps in your belly. °· You have a very bad headache. °Summary °· Antibiotics are often used to treat infections caused by bacteria. °· Only take   these medicines when needed.  Let your doctor know if you have watery poop while taking an antibiotic.  You need to take the entire prescription. This information is not intended to replace advice given to you by your health care provider. Make sure you discuss any questions you have with your health care provider. Document Released: 03/22/2008 Document Revised: 12/12/2017 Document Reviewed: 06/15/2016 Elsevier Interactive Patient Education  2019 Elsevier Inc.   Acute Kidney Injury, Adult  Acute kidney injury is a sudden worsening of kidney function. The kidneys are organs  that have several jobs. They filter the blood to remove waste products and extra fluid. They also maintain a healthy balance of minerals and hormones in the body, which helps control blood pressure and keep bones strong. With this condition, your kidneys do not do their jobs as well as they should. This condition ranges from mild to severe. Over time it may develop into long-lasting (chronic) kidney disease. Early detection and treatment may prevent acute kidney injury from developing into a chronic condition. What are the causes? Common causes of this condition include:  A problem with blood flow to the kidneys. This may be caused by: ? Low blood pressure (hypotension) or shock. ? Blood loss. ? Heart and blood vessel (cardiovascular) disease. ? Severe burns. ? Liver disease.  Direct damage to the kidneys. This may be caused by: ? Certain medicines. ? A kidney infection. ? Poisoning. ? Being around or in contact with toxic substances. ? A surgical wound. ? A hard, direct hit to the kidney area.  A sudden blockage of urine flow. This may be caused by: ? Cancer. ? Kidney stones. ? An enlarged prostate in males. What are the signs or symptoms? Symptoms of this condition may not be obvious until the condition becomes severe. Symptoms of this condition can include:  Tiredness (lethargy), or difficulty staying awake.  Nausea or vomiting.  Swelling (edema) of the face, legs, ankles, or feet.  Problems with urination, such as: ? Abdominal pain, or pain along the side of your stomach (flank). ? Decreased urine production. ? Decrease in the force of urine flow.  Muscle twitches and cramps, especially in the legs.  Confusion or trouble concentrating.  Loss of appetite.  Fever. How is this diagnosed? This condition may be diagnosed with tests, including:  Blood tests.  Urine tests.  Imaging tests.  A test in which a sample of tissue is removed from the kidneys to be examined  under a microscope (kidney biopsy). How is this treated? Treatment for this condition depends on the cause and how severe the condition is. In mild cases, treatment may not be needed. The kidneys may heal on their own. In more severe cases, treatment will involve:  Treating the cause of the kidney injury. This may involve changing any medicines you are taking or adjusting your dosage.  Fluids. You may need specialized IV fluids to balance your body's needs.  Having a catheter placed to drain urine and prevent blockages.  Preventing problems from occurring. This may mean avoiding certain medicines or procedures that can cause further injury to the kidneys. In some cases treatment may also require:  A procedure to remove toxic wastes from the body (dialysis or continuous renal replacement therapy - CRRT).  Surgery. This may be done to repair a torn kidney, or to remove the blockage from the urinary system. Follow these instructions at home: Medicines  Take over-the-counter and prescription medicines only as told by your health care  provider.  Do not take any new medicines without your health care provider's approval. Many medicines can worsen your kidney damage.  Do not take any vitamin and mineral supplements without your health care provider's approval. Many nutritional supplements can worsen your kidney damage. Lifestyle  If your health care provider prescribed changes to your diet, follow them. You may need to decrease the amount of protein you eat.  Achieve and maintain a healthy weight. If you need help with this, ask your health care provider.  Start or continue an exercise plan. Try to exercise at least 30 minutes a day, 5 days a week.  Do not use any tobacco products, such as cigarettes, chewing tobacco, and e-cigarettes. If you need help quitting, ask your health care provider. General instructions  Keep track of your blood pressure. Report changes in your blood pressure as  told by your health care provider.  Stay up to date with immunizations. Ask your health care provider which immunizations you need.  Keep all follow-up visits as told by your health care provider. This is important. Where to find more information  American Association of Kidney Patients: ResidentialShow.iswww.aakp.org  [employer]: www.kidney.org  American Kidney Fund: FightingMatch.com.eewww.akfinc.org  Life Options Rehabilitation Program: ? www.lifeoptions.org ? www.kidneyschool.org Contact a health care provider if:  Your symptoms get worse.  You develop new symptoms. Get help right away if:  You develop symptoms of worsening kidney disease, which include: ? Headaches. ? Abnormally dark or light skin. ? Easy bruising. ? Frequent hiccups. ? Chest pain. ? Shortness of breath. ? End of menstruation in women. ? Seizures. ? Confusion or altered mental status. ? Abdominal or back pain. ? Itchiness.  You have a fever.  Your body is producing less urine.  You have pain or bleeding when you urinate. Summary  Acute kidney injury is a sudden worsening of kidney function.  Acute kidney injury can be caused by problems with blood flow to the kidneys, direct damage to the kidneys, and sudden blockage of urine flow.  Symptoms of this condition may not be obvious until it becomes severe. Symptoms may include edema, lethargy, confusion, nausea or vomiting, and problems passing urine.  This condition can usually be diagnosed with blood tests, urine tests, and imaging tests. Sometimes a kidney biopsy is done to diagnose this condition.  Treatment for this condition often involves treating the underlying cause. It is treated with fluids, medicines, dialysis, diet changes, or surgery. This information is not intended to replace advice given to you by your health care provider. Make sure you discuss any questions you have with your health care provider. Document Released: 12/27/2010 Document Revised:  10/13/2016 Document Reviewed: 06/03/2016 Elsevier Interactive Patient Education  2019 [employer]

## 2018-11-12 LAB — CULTURE, BLOOD (ROUTINE X 2): Special Requests: ADEQUATE

## 2018-11-15 LAB — CULTURE, BLOOD (ROUTINE X 2)
Culture: NO GROWTH
Culture: NO GROWTH
Special Requests: ADEQUATE
Special Requests: ADEQUATE

## 2018-11-30 ENCOUNTER — Other Ambulatory Visit: Payer: Self-pay | Admitting: Cardiovascular Disease

## 2019-01-07 ENCOUNTER — Ambulatory Visit (HOSPITAL_COMMUNITY): Payer: BC Managed Care – PPO | Attending: Internal Medicine

## 2019-01-07 ENCOUNTER — Other Ambulatory Visit: Payer: Self-pay

## 2019-01-07 DIAGNOSIS — I255 Ischemic cardiomyopathy: Secondary | ICD-10-CM | POA: Diagnosis not present

## 2019-01-07 MED ORDER — PERFLUTREN LIPID MICROSPHERE
1.0000 mL | INTRAVENOUS | Status: AC | PRN
  Administered 2019-01-07: 3 mL via INTRAVENOUS

## 2019-02-04 ENCOUNTER — Other Ambulatory Visit: Payer: Self-pay | Admitting: Cardiovascular Disease

## 2019-03-08 ENCOUNTER — Other Ambulatory Visit: Payer: Self-pay

## 2019-03-08 DIAGNOSIS — Z20822 Contact with and (suspected) exposure to covid-19: Secondary | ICD-10-CM

## 2019-03-11 LAB — NOVEL CORONAVIRUS, NAA: SARS-CoV-2, NAA: NOT DETECTED

## 2019-03-15 ENCOUNTER — Other Ambulatory Visit: Payer: Self-pay | Admitting: Cardiovascular Disease

## 2019-03-16 ENCOUNTER — Other Ambulatory Visit: Payer: Self-pay | Admitting: Cardiovascular Disease

## 2019-03-19 ENCOUNTER — Telehealth: Payer: Self-pay | Admitting: Cardiovascular Disease

## 2019-03-19 DIAGNOSIS — I480 Paroxysmal atrial fibrillation: Secondary | ICD-10-CM

## 2019-03-19 DIAGNOSIS — I10 Essential (primary) hypertension: Secondary | ICD-10-CM

## 2019-03-19 NOTE — Telephone Encounter (Signed)
It is fine for William Hill to have an ETT

## 2019-03-19 NOTE — Telephone Encounter (Signed)
Last seen 01/26/2018- okay to order stress test. Or visit first?  Thanks !

## 2019-03-19 NOTE — Telephone Encounter (Signed)
Order has been placed-  Called and advised with wife that they would be calling to schedule. Patient wife verbalized understanding.

## 2019-03-19 NOTE — Telephone Encounter (Signed)
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 Please advise. He states that we can speak with his wife Laverda Sorenson [phone]

## 2019-03-19 NOTE — Telephone Encounter (Signed)
LVM for patient to call back and schedule treadmill test.

## 2019-03-20 ENCOUNTER — Telehealth: Payer: Self-pay

## 2019-03-20 ENCOUNTER — Encounter: Payer: Self-pay | Admitting: Cardiovascular Disease

## 2019-03-20 NOTE — Telephone Encounter (Signed)
DPR on file. Spoke with pt spouse and informed her that pt needed covid test on 9/28 for upcoming GXT on 10/1. Appt for COVID test set for 11:05am on 9/28 at [facility] testing site. Advised that pt will need to self isolate after covid test. She verbalized understanding and will relay info to pt.

## 2019-03-20 NOTE — Telephone Encounter (Signed)
-----   Message from Corinna Lines sent at 03/20/2019 10:02 AM EDT ----- Regarding: GXT   William Hill  The patient is scheduled for his GXT on 03-28-19.  Please call him regarding the [place]

## 2019-03-20 NOTE — Telephone Encounter (Signed)
° ° °  Patient called back states he needs to see Dr Gwenlyn Found on a Friday. Patient states he can only be off on a Monday for ETT at any location. Advised patient of open days to get the ETT. He declined.

## 2019-03-20 NOTE — Telephone Encounter (Signed)
Follow Up  Patient is returning your call. States that he thought he was just supposed to be scheduling an office visit with Dr. Gwenlyn Found on Friday. I am not showing any open appointments on Friday. Patient is wanting the call back to be made to his wife to schedule the appointments that are needed.

## 2019-03-22 ENCOUNTER — Other Ambulatory Visit: Payer: Self-pay

## 2019-03-22 DIAGNOSIS — I10 Essential (primary) hypertension: Secondary | ICD-10-CM

## 2019-03-22 DIAGNOSIS — I480 Paroxysmal atrial fibrillation: Secondary | ICD-10-CM

## 2019-03-22 NOTE — Progress Notes (Signed)
Will order a pharmacologic Myoview stress test ===View-only below this line=== ----- Message ----- From: Lorretta Harp, MD Sent: 03/22/2019   8:52 AM EDT To: Annita Brod, RN Subject: RE: ETT                                        With atrial fibrillation on his EKG, he probably should have a pharmacologic Myoview stress test instead. ----- Message ----- From: Annita Brod, RN Sent: 03/22/2019   8:18 AM EDT To: Lorretta Harp, MD Subject: FW: ETT                                         ----- Message ----- From: Georga Kaufmann, CCT Sent: 03/21/2019  11:06 AM EDT To: Annita Brod, RN Subject: ETT                                            Good Morning,  Mr Sahli has an ETT scheduled on 03/29/19 for DOT requirements. The last ekg in his chart is from 11/09/18 and he was in afib which he has a history of. I need Dr Kennon Holter advice on how to proceed.        Order in [place]

## 2019-03-25 ENCOUNTER — Other Ambulatory Visit (HOSPITAL_COMMUNITY)
Admission: RE | Admit: 2019-03-25 | Discharge: 2019-03-25 | Disposition: A | Discharge status: Home / Self Care | Payer: BC Managed Care – PPO | Source: Ambulatory Visit | Attending: Cardiovascular Disease | Admitting: Cardiovascular Disease

## 2019-03-25 DIAGNOSIS — Z20828 Contact with and (suspected) exposure to other viral communicable diseases: Secondary | ICD-10-CM | POA: Diagnosis not present

## 2019-03-25 DIAGNOSIS — Z01812 Encounter for preprocedural laboratory examination: Secondary | ICD-10-CM | POA: Insufficient documentation

## 2019-03-25 LAB — SARS CORONAVIRUS 2 (TAT 6-24 HRS): SARS Coronavirus 2: NEGATIVE

## 2019-03-27 ENCOUNTER — Encounter (HOSPITAL_COMMUNITY): Payer: Self-pay | Admitting: Cardiovascular Disease

## 2019-03-27 ENCOUNTER — Telehealth (HOSPITAL_COMMUNITY): Payer: Self-pay

## 2019-03-27 NOTE — Telephone Encounter (Signed)
Encounter complete. 

## 2019-03-28 ENCOUNTER — Inpatient Hospital Stay (HOSPITAL_COMMUNITY): Admission: RE | Admit: 2019-03-28 | Payer: BC Managed Care – PPO | Source: Ambulatory Visit

## 2019-03-29 ENCOUNTER — Other Ambulatory Visit: Payer: Self-pay

## 2019-03-29 ENCOUNTER — Encounter (HOSPITAL_COMMUNITY): Payer: BC Managed Care – PPO

## 2019-03-29 ENCOUNTER — Ambulatory Visit (HOSPITAL_COMMUNITY)
Admission: RE | Admit: 2019-03-29 | Discharge: 2019-03-29 | Disposition: A | Discharge status: Home / Self Care | Payer: BC Managed Care – PPO | Source: Ambulatory Visit | Attending: Cardiology | Admitting: Cardiology

## 2019-03-29 DIAGNOSIS — I10 Essential (primary) hypertension: Secondary | ICD-10-CM

## 2019-03-29 DIAGNOSIS — I480 Paroxysmal atrial fibrillation: Secondary | ICD-10-CM | POA: Diagnosis not present

## 2019-03-29 LAB — MYOCARDIAL PERFUSION IMAGING
Peak HR: 90 {beats}/min
Rest HR: 62 {beats}/min
SDS: 2
SRS: 25
SSS: 27
TID: 1.15

## 2019-03-29 MED ORDER — TECHNETIUM TC 99M TETROFOSMIN IV KIT
32.0000 | PACK | Freq: Once | INTRAVENOUS | Status: AC | PRN
  Administered 2019-03-29: 32 via INTRAVENOUS
  Filled 2019-03-29: qty 32

## 2019-03-29 MED ORDER — AMINOPHYLLINE 25 MG/ML IV SOLN
75.0000 mg | Freq: Once | INTRAVENOUS | Status: AC
  Administered 2019-03-29: 75 mg via INTRAVENOUS

## 2019-03-29 MED ORDER — REGADENOSON 0.4 MG/5ML IV SOLN
0.4000 mg | Freq: Once | INTRAVENOUS | Status: AC
  Administered 2019-03-29: 0.4 mg via INTRAVENOUS

## 2019-03-29 MED ORDER — TECHNETIUM TC 99M TETROFOSMIN IV KIT
10.2000 | PACK | Freq: Once | INTRAVENOUS | Status: AC | PRN
  Administered 2019-03-29: 10.2 via INTRAVENOUS
  Filled 2019-03-29: qty 11

## 2019-04-05 ENCOUNTER — Encounter: Payer: Self-pay | Admitting: Cardiovascular Disease

## 2019-04-05 ENCOUNTER — Other Ambulatory Visit: Payer: Self-pay

## 2019-04-05 ENCOUNTER — Telehealth: Payer: Self-pay

## 2019-04-05 ENCOUNTER — Ambulatory Visit: Payer: BC Managed Care – PPO | Admitting: Cardiovascular Disease

## 2019-04-05 DIAGNOSIS — I482 Chronic atrial fibrillation, unspecified: Secondary | ICD-10-CM | POA: Diagnosis not present

## 2019-04-05 DIAGNOSIS — I2510 Atherosclerotic heart disease of native coronary artery without angina pectoris: Secondary | ICD-10-CM

## 2019-04-05 DIAGNOSIS — E782 Mixed hyperlipidemia: Secondary | ICD-10-CM | POA: Diagnosis not present

## 2019-04-05 DIAGNOSIS — I255 Ischemic cardiomyopathy: Secondary | ICD-10-CM

## 2019-04-05 DIAGNOSIS — I10 Essential (primary) hypertension: Secondary | ICD-10-CM

## 2019-04-05 NOTE — Patient Instructions (Signed)

## 2019-04-05 NOTE — Telephone Encounter (Signed)
Pt provided with PHI disclosure form during his appt today to complete and return to the office. He states he will get in contact with another office to get details on what info is needed from [facility] office

## 2019-04-05 NOTE — Assessment & Plan Note (Signed)
History of hyperlipidemia on statin therapy with lipid profile performed 02/04/2019 revealing total cholesterol 100, LDL 31 HDL 30.

## 2019-04-05 NOTE — Assessment & Plan Note (Signed)
History of chronic A. fib rate controlled on Xarelto oral anticoagulation. 

## 2019-04-05 NOTE — Assessment & Plan Note (Signed)
History of CAD in the setting of anterior STEMI 1999 treated with PCI and stenting of the LAD at [facility] without recurrence

## 2019-04-05 NOTE — Progress Notes (Signed)
04/05/2019 William Hill   [birth year]  [id number]  [facility], [facility] Primary Cardiologist: William Gess MD William Hill, [place], [place]  HPI:  William [name] a 53 y.o. mildly 01/26/2018, divorced [place], father of 3 stepchildren, who I saw 01/20/2017.  He is accompanied by his wife William Hill today.  He has a history of CAD status post anterior wall myocardial infarction back in 1999 with PCI and stenting at [facility]. His other problems include discontinued tobacco abuse, hypertension, and hyperlipidemia. 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 His last lipid profile performed 04/22/13 revealed a total cholesterol of 145, LDL 75 HDL of 37. A Myoview stress test performed in our office 04/04/13 revealed scar in the LAD territory without ischemia. Since I saw him in the office one year ago he's remained currently stable denying chest pain or shortness of breath.When I saw him one year ago he was in atrial fibrillation with a controlled ventricular response but was asymptomatic. I did begin him on Xarelto.  His most recent 2D echo performed 01/07/2019 shows persistently reduced EF of 30 to 35% which is not changed.  A pharmacologic Myoview stress test performed 03/29/2019 showed scar in the LAD territory  unchanged from prior  Myoview.    Since I saw him back a year ago he is remained stable.  He has lost 25 to 30 pounds and feels symptomatically improved.  He does not snore anymore.  He was admitted with sepsis due to a cat scratch fever in May which she has recovered from..    Current Meds  Medication Sig  . acetaminophen (TYLENOL) 325 MG tablet Take 2 tablets (650 mg total) by mouth every 6 (six) hours as needed for mild pain (or Fever >/= 101).  Marland Kitchen atorvastatin (LIPITOR) 40 MG tablet TAKE 1 TABLET BY MOUTH AT  BEDTIME  . benazepril (LOTENSIN) 20 MG tablet TAKE 1 TABLET BY MOUTH EVERY DAY  . carvedilol (COREG) 25 MG tablet Take 1 tablet (25 mg total) by mouth 2 (two) times daily. OV NEEDED  . fish oil-omega-3 fatty acids 1000 MG capsule Take 1 g by mouth 2 (two) times daily.  Marland Kitchen glyBURIDE-metformin (GLUCOVANCE) 2.5-500 MG tablet Take half tablet by mouth twice a day for 2 weeks; then increase to full tablet by mouth twice a day.  . Multiple Vitamin (MULTIVITAMIN WITH MINERALS) TABS tablet Take 1 tablet by mouth daily.  . niacin 500 MG tablet Take 500 mg by mouth daily with breakfast.  . saccharomyces boulardii (FLORASTOR) 250 MG capsule Take 1 capsule (250 mg total) by mouth 2 (two) times daily.  Carlena Hurl 20 MG TABS tablet TAKE ONE TABLET BY MOUTH ONCE DAILY WITH SUPPER  . [DISCONTINUED] aspirin 81 MG tablet Take 1 tablet (81 mg total) by mouth daily with breakfast.     Allergies  Allergen Reactions  . Aspirin     Cannot take due to being on blood thinner-per pt    Social History   Socioeconomic History  . Marital status: Married    Spouse name: Not on file  . Number of children: Not on file  . Years of education: Not on file  . Highest education level: Not on file  Occupational History  . Not on file  Social Needs  . Financial resource strain:  Not on file  . Food insecurity    Worry: Not on file    Inability: Not on file  . Transportation needs    Medical: Not on file    Non-medical: Not on file  Tobacco Use  . Smoking status: Never Smoker  . Smokeless tobacco: Never Used  Substance and Sexual Activity  . Alcohol use: No  . Drug use: Never  . Sexual activity: Not on file  Lifestyle  . Physical activity    Days per week: Not on file    Minutes per session: Not on file  . Stress: Not on file  Relationships  . Social Herbalist on phone: Not on file    Gets together: Not on file    Attends religious service: Not on file    Active member of club or organization:  Not on file    Attends meetings of clubs or organizations: Not on file    Relationship status: Not on file  . Intimate partner violence    Fear of current or ex partner: Not on file    Emotionally abused: Not on file    Physically abused: Not on file    Forced sexual activity: Not on file  Other Topics Concern  . Not on file  Social History Narrative  . Not on file     Review of Systems: General: negative for chills, fever, night sweats or weight changes.  Cardiovascular: negative for chest pain, dyspnea on exertion, edema, orthopnea, palpitations, paroxysmal nocturnal dyspnea or shortness of breath Dermatological: negative for rash Respiratory: negative for cough or wheezing Urologic: negative for hematuria Abdominal: negative for nausea, vomiting, diarrhea, bright red blood per rectum, melena, or hematemesis Neurologic: negative for visual changes, syncope, or dizziness All other systems reviewed and are otherwise negative except as noted above.    Blood pressure 114/62, pulse 74, temperature (!) 97.5 F (36.4 C), height 5' 9.5" (1.765 m), weight 203 lb (92.1 kg).  General appearance: alert and no distress Neck: no adenopathy, no carotid bruit, no JVD, supple, symmetrical, trachea midline and thyroid not enlarged, symmetric, no tenderness/mass/nodules Lungs: clear to auscultation bilaterally Heart: irregularly irregular rhythm Extremities: extremities normal, atraumatic, no cyanosis or edema Pulses: 2+ and symmetric Skin: Skin color, texture, turgor normal. No rashes or lesions Neurologic: Alert and oriented X 3, normal strength and tone. Normal symmetric reflexes. Normal coordination and gait  EKG not performed today  ASSESSMENT AND PLAN:   Essential hypertension History of essential hypertension with blood pressure measured today 114/62.  He is on Lotensin and carvedilol.  Hyperlipidemia History of hyperlipidemia on statin therapy with lipid profile performed  02/04/2019 revealing total cholesterol 100, LDL 31 HDL 30.  Cardiomyopathy, ischemic History of ischemic cardiomyopathy with anterior myocardial infarction back in 1999 with PCI and stenting at [facility].  His EF is been persistently in the 33 to 35% range which has not changed by recent 2D echo performed 01/07/2019.  He is completely asymptomatic.  He did have a Myoview stress test as well 03/29/2019 that showed anteroapical scar without ischemia, unchanged from prior studies.  Atrial fibrillation (HCC) History of chronic A. fib rate controlled on Xarelto oral anticoagulation.  CAD (coronary artery disease) History of CAD in the setting of anterior STEMI 1999 treated with PCI and stenting of the LAD at [facility] without recurrence      Lorretta Harp MD [facility], [facility] 04/05/2019 4:58 PM

## 2019-04-05 NOTE — Assessment & Plan Note (Signed)
History of essential hypertension with blood pressure measured today 114/62.  He is on Lotensin and carvedilol.

## 2019-04-05 NOTE — Assessment & Plan Note (Signed)
History of ischemic cardiomyopathy with anterior myocardial infarction back in 1999 with PCI and stenting at [facility].  His EF is been persistently in the 33 to 35% range which has not changed by recent 2D echo performed 01/07/2019.  He is completely asymptomatic.  He did have a Myoview stress test as well 03/29/2019 that showed anteroapical scar without ischemia, unchanged from prior studies.

## 2019-04-10 ENCOUNTER — Other Ambulatory Visit: Payer: Self-pay | Admitting: Cardiovascular Disease

## 2019-04-10 NOTE — Telephone Encounter (Signed)
Please review for Xarelto refill, Thanks! 

## 2019-04-30 ENCOUNTER — Other Ambulatory Visit: Payer: Self-pay | Admitting: *Deleted

## 2019-04-30 DIAGNOSIS — Z20822 Contact with and (suspected) exposure to covid-19: Secondary | ICD-10-CM

## 2019-05-01 LAB — NOVEL CORONAVIRUS, NAA: SARS-CoV-2, NAA: NOT DETECTED

## 2019-05-07 ENCOUNTER — Telehealth: Payer: Self-pay | Admitting: Cardiovascular Disease

## 2019-05-07 NOTE — Telephone Encounter (Signed)
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 Would like an order put in to get this scheduled.

## 2019-05-07 NOTE — Telephone Encounter (Signed)
Fwd to provider for approval.

## 2019-05-08 NOTE — Telephone Encounter (Signed)
Follow up     Pt is returning call and is wondering if he can schedule his stress test     He also says he turned in paperwork for FMLA in October and has not yet heard anything back yet    Please call back

## 2019-05-09 NOTE — Telephone Encounter (Signed)
Returned call and s/w wife (DPR) she states that pt discussed stress test with his occupational health [facility] Dr Andree Elk [phone] and he said per pt/wife that stress test needs to be re-done. I will call and get further explanation from them.   Called and s/w Dr Andree Elk at [facility] he is the DOT reviewing Physician for pt's workplace. He states that pt's Myoview and ECHO is unacceptable for DOT purposes and he does not pass with current DOT guidelines d/t his EF per these guidelines should be >40% and current Stress test shows that his METS is 1.0. per DOT guidelines for Lexiscan METS should be 6.0. 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  Please have Dr Gwenlyn Found call Dr Andree Elk for further discussionwbout pt's DOT restriction and follow up letter @[phone] and fax letter to [phone].

## 2019-05-12 ENCOUNTER — Other Ambulatory Visit: Payer: Self-pay | Admitting: Cardiovascular Disease

## 2019-05-12 NOTE — Telephone Encounter (Signed)
I called Dr Baruch Merl office and left a message for him to call me back to discuss

## 2019-05-15 ENCOUNTER — Other Ambulatory Visit: Payer: Self-pay | Admitting: Cardiovascular Disease

## 2019-05-29 ENCOUNTER — Telehealth: Payer: Self-pay | Admitting: Cardiovascular Disease

## 2019-05-29 NOTE — Telephone Encounter (Signed)
Sent to medical records. 

## 2019-05-29 NOTE — Telephone Encounter (Signed)
Patient is calling in regards to some FMLA paperwork he brought to Dr. Kennon Holter office at his last appointment on 04/05/19. Patient states he has not heard anything back regarding if it had been sent to [facility]. Please Advise.

## 2019-06-13 ENCOUNTER — Other Ambulatory Visit: Payer: Self-pay | Admitting: Cardiovascular Disease

## 2019-06-13 NOTE — Telephone Encounter (Signed)
Rx request sent to pharmacy.  

## 2019-06-17 ENCOUNTER — Other Ambulatory Visit: Payer: Self-pay | Admitting: Cardiovascular Disease

## 2019-06-24 ENCOUNTER — Other Ambulatory Visit: Payer: Self-pay | Admitting: Cardiovascular Disease

## 2019-07-02 ENCOUNTER — Other Ambulatory Visit: Payer: Self-pay

## 2019-07-02 ENCOUNTER — Ambulatory Visit: Payer: BC Managed Care – PPO | Attending: Internal Medicine

## 2019-07-02 DIAGNOSIS — Z20822 Contact with and (suspected) exposure to covid-19: Secondary | ICD-10-CM

## 2019-07-03 LAB — NOVEL CORONAVIRUS, NAA: SARS-CoV-2, NAA: NOT DETECTED

## 2019-07-17 ENCOUNTER — Other Ambulatory Visit: Payer: Self-pay | Admitting: Cardiovascular Disease

## 2019-07-26 ENCOUNTER — Other Ambulatory Visit: Payer: Self-pay

## 2019-07-26 ENCOUNTER — Ambulatory Visit: Payer: BC Managed Care – PPO | Attending: Internal Medicine

## 2019-07-26 DIAGNOSIS — Z20822 Contact with and (suspected) exposure to covid-19: Secondary | ICD-10-CM

## 2019-07-27 LAB — NOVEL CORONAVIRUS, NAA: SARS-CoV-2, NAA: NOT DETECTED

## 2019-08-18 ENCOUNTER — Other Ambulatory Visit: Payer: Self-pay | Admitting: Cardiovascular Disease

## 2019-10-29 ENCOUNTER — Other Ambulatory Visit: Payer: Self-pay

## 2019-10-29 ENCOUNTER — Ambulatory Visit: Payer: BC Managed Care – PPO | Attending: Internal Medicine

## 2019-10-29 DIAGNOSIS — Z20822 Contact with and (suspected) exposure to covid-19: Secondary | ICD-10-CM

## 2019-10-30 LAB — NOVEL CORONAVIRUS, NAA: SARS-CoV-2, NAA: NOT DETECTED

## 2019-10-30 LAB — SARS-COV-2, NAA 2 DAY TAT

## 2019-12-11 ENCOUNTER — Telehealth: Payer: Self-pay | Admitting: Cardiovascular Disease

## 2019-12-11 NOTE — Telephone Encounter (Signed)
New Message  Patient is calling in to schedule an echocardiogram on a Monday (only days he has off work). Please assist with placing order for echocardiogram and giving patient a call back to schedule. Patient states that appointment can be scheduled with his wife if he is not able to come to the phone.

## 2019-12-11 NOTE — Telephone Encounter (Signed)
Dr.Berry can you please advise if the patient needs a repeat ECHO- I seen the last one 6 months ago, but it did not say anything about repeating the study.   Okay to put order for ECHO? Thanks!

## 2019-12-12 NOTE — Telephone Encounter (Signed)
Called, and spoke to wife- advised of message. She verbalized understanding.

## 2019-12-12 NOTE — Telephone Encounter (Signed)
No reason to repeat. EF has been stable for years

## 2020-01-18 ENCOUNTER — Other Ambulatory Visit: Payer: Self-pay | Admitting: Cardiovascular Disease

## 2020-02-15 ENCOUNTER — Other Ambulatory Visit: Payer: Self-pay | Admitting: Cardiovascular Disease

## 2020-05-24 ENCOUNTER — Other Ambulatory Visit: Payer: Self-pay | Admitting: Cardiovascular Disease

## 2020-06-29 ENCOUNTER — Other Ambulatory Visit: Payer: Self-pay | Admitting: Cardiovascular Disease

## 2020-07-25 ENCOUNTER — Other Ambulatory Visit: Payer: Self-pay | Admitting: Cardiovascular Disease

## 2020-08-19 ENCOUNTER — Other Ambulatory Visit: Payer: Self-pay | Admitting: Cardiovascular Disease

## 2020-08-22 ENCOUNTER — Other Ambulatory Visit: Payer: Self-pay | Admitting: Cardiovascular Disease

## 2020-08-22 IMAGING — CR PORTABLE CHEST - 1 VIEW
1 series · 1 of 1 positions shown · non-contrast
Comparison: 11/08/2018 and earlier.

CLINICAL DATA: 52-year-old male IJ line placement.

EXAM:
PORTABLE CHEST 1 VIEW

[portable]
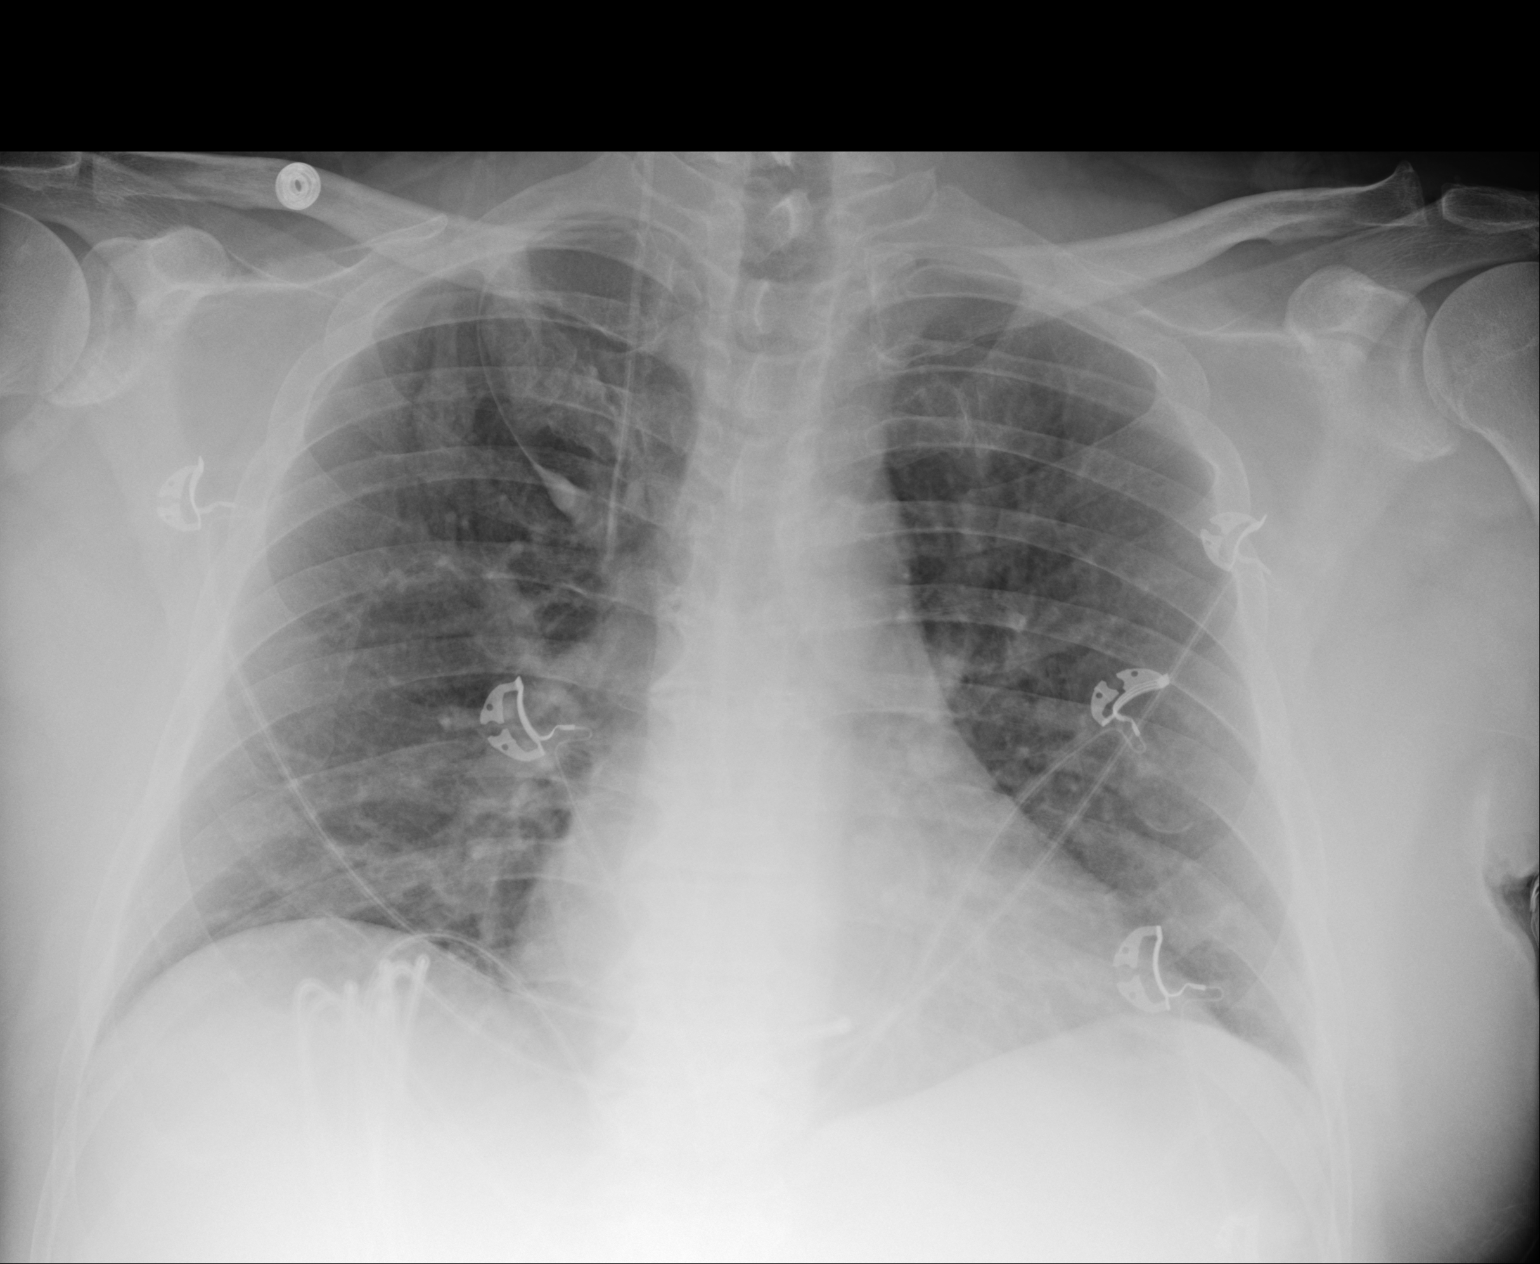

[1 of 1 positions shown; findings below may reference images not displayed]

FINDINGS: Portable AP upright view at 4746 hours. Right IJ central line
placed, tip at the level of the carina, lower SVC. Incidental azygos
fissure (normal variant). Stable cardiac size and mediastinal
contours. No pneumothorax. Stable lung volumes and ventilation.
IMPRESSION: Right IJ central line placed with tip at the lower SVC level and no
adverse features.

## 2020-09-29 ENCOUNTER — Other Ambulatory Visit: Payer: Self-pay | Admitting: Cardiovascular Disease

## 2020-10-27 ENCOUNTER — Other Ambulatory Visit: Payer: Self-pay | Admitting: Cardiovascular Disease

## 2020-11-01 ENCOUNTER — Other Ambulatory Visit: Payer: Self-pay | Admitting: Cardiovascular Disease

## 2020-11-02 NOTE — Telephone Encounter (Signed)
CCR BASED ON OLD LABS AND WEIGHT IS 109.1 WILL SEND IN 1 MONTH SUPPLY WITH A NOTE SAYING LABS AND APPT FOR FURTHER REFILLS

## 2020-11-02 NOTE — Telephone Encounter (Signed)
57m, 92.1kg, scr 1.02 02/06/19 (OVERDUE), lovw/berry 04/05/19(OVERDUE)

## 2020-12-02 ENCOUNTER — Other Ambulatory Visit: Payer: Self-pay | Admitting: Cardiovascular Disease

## 2020-12-02 NOTE — Telephone Encounter (Signed)
63m, 92.1kg, Creatinine, Serum 1.020 MG/ 02/04/2019 (OUTDATED), lovw/berry 04/05/19

## 2020-12-30 ENCOUNTER — Ambulatory Visit: Payer: BC Managed Care – PPO | Admitting: Medical

## 2020-12-30 ENCOUNTER — Other Ambulatory Visit: Payer: Self-pay

## 2020-12-30 ENCOUNTER — Encounter: Payer: Self-pay | Admitting: Medical

## 2020-12-30 VITALS — BP 118/76 | HR 74 | Ht 69.0 in | Wt 208.6 lb

## 2020-12-30 DIAGNOSIS — I10 Essential (primary) hypertension: Secondary | ICD-10-CM

## 2020-12-30 DIAGNOSIS — I483 Typical atrial flutter: Secondary | ICD-10-CM | POA: Diagnosis not present

## 2020-12-30 DIAGNOSIS — E785 Hyperlipidemia, unspecified: Secondary | ICD-10-CM

## 2020-12-30 DIAGNOSIS — I255 Ischemic cardiomyopathy: Secondary | ICD-10-CM

## 2020-12-30 DIAGNOSIS — I5022 Chronic systolic (congestive) heart failure: Secondary | ICD-10-CM

## 2020-12-30 DIAGNOSIS — I2510 Atherosclerotic heart disease of native coronary artery without angina pectoris: Secondary | ICD-10-CM | POA: Diagnosis not present

## 2020-12-30 MED ORDER — DAPAGLIFLOZIN PROPANEDIOL 10 MG PO TABS: 10.0000 mg | ORAL_TABLET | Freq: Every day | ORAL | 0 refills | Status: DC

## 2020-12-30 MED ORDER — SACUBITRIL-VALSARTAN 24-26 MG PO TABS: 1.0000 | ORAL_TABLET | Freq: Two times a day (BID) | ORAL | Status: DC

## 2020-12-30 NOTE — Patient Instructions (Addendum)
Medication Instructions:   Stop Benazepril. Start Farxiga 10 mg (1 Tablet Daily). Start Entresto 24/26 mg (1 Tablet Twice Daily) *If you need a refill on your cardiac medications before your next appointment, please call your pharmacy*   Lab Work: BMP, Lipid. ( To be done in 2 weeks). If you have labs (blood work) drawn today and your tests are completely normal, you will receive your results only by: MyChart Message (if you have MyChart) OR A paper copy in the mail If you have any lab test that is abnormal or we need to change your treatment, we will call you to review the results.   Testing/Procedures:  [address], [address]. Your physician has requested that you have a lexiscan myoview. For further information please visit [email or web address] Please follow instruction sheet, as given.    [address], [address] Your physician has requested that you have an echocardiogram. Echocardiography is a painless test that uses sound waves to create images of your heart. It provides your doctor with information about the size and shape of your heart and how well your heart's chambers and valves are working. This procedure takes approximately one hour. There are no restrictions for this procedure.   Follow-Up: At [facility], you and your health needs are our priority.  As part of our continuing mission to provide you with exceptional heart care, we have created designated Provider Care Teams.  These Care Teams include your primary Cardiologist (physician) and Advanced Practice Providers (APPs -  Physician Assistants and Nurse Practitioners) who all work together to provide you with the care you need, when you need it.   Your next appointment:   4 month(s)  The format for your next appointment:   In Person  Provider:   You will see one of the following Advanced Practice Providers on your designated Care Team:    Judy Pimple, PA-C   Please Schedule  appointment with Pharm D for either 7/18 or 7/25

## 2020-12-30 NOTE — Progress Notes (Signed)
Cardiology Office Note   Date:  12/30/2020   ID:  William Hill, DOB April 22, 1966, MRN [id number]  PCP:  [facility]  Cardiologist:  Nanetta Batty, MD EP: None  Chief Complaint  Patient presents with   Follow-up    CAD, CHF, Afib; DOT eval      History of Present Illness: William [name] a 55 y.o. male with a PMH of CAD s/p remote MI with PCI to LAD in 1999, ICM/chronic combined CHF, permanent atrial fibrillation, HTN, HLD, who presents for follow-up and DOT eval.   He was last evaluated by cardiology at an outpatient visit with Dr. Allyson Sabal 03/2019, at which time he was doing well, with recent 25-30lb weight loss. No medication changes occurred and he was recommended to follow-up in 1 year.   His cardiac history dates back to 1999 when he suffered an anterior MI with stenting to LAD at [facility]. He had an ischemic cardiomyopathy with EF <35%. Most recent echocardiogram 12/2018 showed EF 30-35%, moderately dilated LV, akinesis of the distal 1/3 of the LV, indeterminate LV diastolic function, and no significant valvular abnormalities. His last ischemic evaluation was a NST 03/2019 showed large anterior wall defect c/w scar/prior MI, though no ischemia.   He presents today for routine follow-up with his wife. Overall he has been doing fairly well from a cardiac standpoint since his last visit. He has not had any issues with chest pain, SOB, DOE, palpitations, dizziness, lightheadedness, syncope, orthopnea, PND, or LE edema. He denies snoring or daytime somnolence. 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 We had a lengthy discussion regarding his cardiomyopathy history and his persistently low EF of 30-35% (highest reported was 35-40% in 2014. We discussed aggressive GDMT options available  at this time, including starting entresto and farxiga which he is agreeable to.     Past Medical History:  Diagnosis Date    CAD (coronary artery disease)    post anterior wall myocardial infarction in 1999 with PCI and stenting at [facility].   Heart attack [facility])    History of stress test 12/2010   EF of 36% with apical scar   Hx of echocardiogram 02/13/2012   EF 35%. There is apical dyskinesis   Hx of echocardiogram 05/09/2012   difinity contrast that showed no apical mural thrombus with an EF of 30%-35%   Hyperlipidemia    Hypertension     Past Surgical History:  Procedure Laterality Date   BACK SURGERY     CORONARY ANGIOPLASTY       Current Outpatient Medications  Medication Sig Dispense Refill   acetaminophen (TYLENOL) 325 MG tablet Take 2 tablets (650 mg total) by mouth every 6 (six) hours as needed for mild pain (or Fever >/= 101). 12 tablet 0   atorvastatin (LIPITOR) 40 MG tablet TAKE 1 TABLET BY MOUTH EVERYDAY AT BEDTIME 30 tablet 11   carvedilol (COREG) 25 MG tablet TAKE 1 TABLET BY MOUTH TWICE A DAY 60 tablet 11   dapagliflozin propanediol (FARXIGA) 10 MG TABS tablet Take 1 tablet (10 mg total) by mouth daily before breakfast. 30 tablet 0   fish oil-omega-3 fatty acids 1000 MG capsule Take 1 g by mouth 2 (two) times daily.     glyBURIDE-metformin (GLUCOVANCE) 2.5-500 MG tablet Take half tablet by mouth twice a day for 2 weeks; then increase to full tablet by mouth twice a day.  60 tablet 3   Multiple Vitamin (MULTIVITAMIN WITH MINERALS) TABS tablet Take 1 tablet by mouth daily.     niacin 500 MG tablet Take 500 mg by mouth daily with breakfast.     rivaroxaban (XARELTO) 20 MG TABS tablet Take 1 tablet (20 mg total) by mouth daily with supper. LABS AND APPOINTMENT NEEDED FOR FURTHER REFILLS 14 tablet 0   Current Facility-Administered Medications  Medication Dose Route Frequency Provider Last Rate Last Admin   sacubitril-valsartan (ENTRESTO) 24-26 mg per tablet  1 tablet Oral BID Zygmunt Mcglinn M., PA-C        Allergies:   Aspirin    Social History:  The patient  reports that he has  never smoked. He has never used smokeless tobacco. He reports that he does not drink alcohol and does not use drugs.   Family History:  The patient's family history includes Cancer in his father; Heart Problems in his maternal grandmother; Heart attack in his paternal grandfather; Stroke in his maternal grandfather.    ROS:  Please see the history of present illness.   Otherwise, review of systems are positive for none.   All other systems are reviewed and negative.    PHYSICAL EXAM: VS:  BP 118/76   Pulse 74   Ht 5\' 9"  (1.753 m)   Wt 208 lb 9.6 oz (94.6 kg)   SpO2 100%   BMI 30.80 kg/m  , BMI Body mass index is 30.8 kg/m. GEN: Well nourished, well developed, in no acute distress HEENT: sclera anicteric Neck: no JVD, carotid bruits, or masses Cardiac: IRIR; no murmurs, rubs, or gallops, no edema  Respiratory:  clear to auscultation bilaterally, normal work of breathing GI: soft, nontender, nondistended, + BS MS: no deformity or atrophy Skin: warm and dry, no rash Neuro:  Strength and sensation are intact Psych: euthymic mood, full affect   EKG:  EKG is ordered today. The ekg ordered today demonstrates atrial flutter with 4:1 AV block, rate 74 bpm, no STE/D; prior EKG with atrial fibrillation   Recent Labs: No results found for requested labs within last 8760 hours.    Lipid Panel    Component Value Date/Time   CHOL 132 01/12/2018 1130   TRIG 204 (H) 01/12/2018 1130   HDL 30 (L) 01/12/2018 1130   CHOLHDL 4.4 01/12/2018 1130   CHOLHDL 4.9 03/17/2014 0917   VLDL NOT CALC 03/17/2014 0917   LDLCALC 61 01/12/2018 1130   LDLDIRECT 50 03/17/2014 0917      Wt Readings from Last 3 Encounters:  12/30/20 208 lb 9.6 oz (94.6 kg)  04/05/19 203 lb (92.1 kg)  03/29/19 237 lb (107.5 kg)      Other studies Reviewed: Additional studies/ records that were reviewed today include:   Echocardiogram 12/2018: 1. The left ventricle has moderate-severely reduced systolic function,   with an ejection fraction of 30-35%. The cavity size was moderately  dilated. Left ventricular diastolic Doppler parameters are indeterminate.   2. LVEF is depressed with akinesis of the distal 1/3 of the LV.   3. The right ventricle has normal systolic function. The cavity was  normal. There is no increase in right ventricular wall thickness.   4. The mitral valve is abnormal. Mild thickening of the mitral valve  leaflet.   5. The aortic valve is tricuspid. Mild thickening of the aortic valve.  Aortic valve regurgitation is trivial by color flow Doppler.   6. The left ventricular function has unchanged.   NST 03/2019: There  was no ST segment deviation noted during stress. Defect 1: There is a large defect of severe severity present in the basal anterior, basal anteroseptal, mid anterior, mid anteroseptal, apical anterior, apical septal, apical inferior, apical lateral and apex location. Findings consistent with prior myocardial infarction. This is an intermediate risk study.   Abnormal, intermediate risk stress nuclear study with large prior anterior, septal and apical infarct but no significant ischemia.  Study not gated due to atrial fibrillation.    ASSESSMENT AND PLAN:  1. CAD s/p STEMI with remote stenting to LAD in 1999: no anginal complaints. Not on aspirin given need for anticoagulation. Due for 2-year NST follow-up. Based on my review for DOT cardiovascular guidelines, EF <40% is prohibitive, though will review with Dr. Allyson Sabal - Will check a NST  - Continue atorvastatin and omega 3 - Continue Bblocker  2. ICM/Chronic combined CHF: EF persistently low at 30-35% 12/2018. He has been on benazepril and carvedilol. He appears well compensated and denies any volume overload complaints. Discussed risks of VT/VF with his current EF though he seemed hesitant to ICD consideration at this time. - Will stop benazepril today and start low dose entresto 01/01/21 - Will start farxiga 10mg   daily - Will have him return for BMET in 2 weeks and a pharmacy visit for med titration - Could consider addition of spironolactone if room in BP/Cr at follow-up - may need to downtitrate carvedilol to accommodate.  - Recommended ongoing low salt diet - Will plan to update an echocardiogram in 3 months to reevaluate LV function  3. Persistent atrial flutter: has history of persistent atrial fibrillation, now with 4:1 flutter on EKG today. Overall appears asymptomatic. His Afib history dates back to at least 2019. Does not appear to have had prior attempts to restore sinus rhythm. Unclear what roll this has played in his cardiomyopathy, if any.   - Will refer to EP for assistance with managing his flutter - Continue carvedilol for rate control - Continue xarelto for stroke ppx  4. HTN: BP 118/76 today - Managed in the context of #2  5. HLD: no recent lipids, though well controlled with LDL 31 in 2020. - Will repeat FLP when he comes in for BMET in 2 weeks - Continue atorvastatin  6. DM type 2: A1C 5.3 12/2019; goal <7. On metformin-glyburide per PCP - Will add farxiga as above - Continue metformin-glyburide   Current medicines are reviewed at length with the patient today.  The patient does not have concerns regarding medicines.  The following changes have been made:  As above  Labs/ tests ordered today include:   Orders Placed This Encounter  Procedures   Basic metabolic panel   Lipid panel   Ambulatory referral to Cardiac Electrophysiology   MYOCARDIAL PERFUSION IMAGING   EKG 12-Lead   ECHOCARDIOGRAM COMPLETE     Disposition:   FU with Dr. 01/2020 or myself in 4 months following his echocardiogram   Signed, Allyson Sabal, PA-C  12/30/2020 5:44 PM

## 2021-01-07 NOTE — Addendum Note (Signed)
Addended by: Judy Pimple on: 01/07/2021 08:25 AM   Modules accepted: Orders

## 2021-01-12 LAB — BASIC METABOLIC PANEL
BUN/Creatinine Ratio: 12 (ref 9–20)
BUN: 12 mg/dL (ref 6–24)
CO2: 24 mmol/L (ref 20–29)
Calcium: 9.5 mg/dL (ref 8.7–10.2)
Chloride: 103 mmol/L (ref 96–106)
Creatinine, Ser: 0.97 mg/dL (ref 0.76–1.27)
Glucose: 102 mg/dL — ABNORMAL HIGH (ref 65–99)
Potassium: 5.5 mmol/L — ABNORMAL HIGH (ref 3.5–5.2)
Sodium: 139 mmol/L (ref 134–144)
eGFR: 93 mL/min/{1.73_m2} (ref >59)

## 2021-01-12 LAB — LIPID PANEL
Chol/HDL Ratio: 3.3 ratio (ref 0.0–5.0)
Cholesterol, Total: 112 mg/dL (ref 100–199)
HDL: 34 mg/dL — ABNORMAL LOW (ref >39)
LDL Chol Calc (NIH): 56 mg/dL (ref 0–99)
Triglycerides: 124 mg/dL (ref 0–149)
VLDL Cholesterol Cal: 22 mg/dL (ref 5–40)

## 2021-01-14 ENCOUNTER — Telehealth: Payer: Self-pay | Admitting: *Deleted

## 2021-01-14 DIAGNOSIS — I2510 Atherosclerotic heart disease of native coronary artery without angina pectoris: Secondary | ICD-10-CM

## 2021-01-14 DIAGNOSIS — I10 Essential (primary) hypertension: Secondary | ICD-10-CM

## 2021-01-14 DIAGNOSIS — E1165 Type 2 diabetes mellitus with hyperglycemia: Secondary | ICD-10-CM

## 2021-01-14 DIAGNOSIS — E785 Hyperlipidemia, unspecified: Secondary | ICD-10-CM

## 2021-01-14 DIAGNOSIS — Z79899 Other long term (current) drug therapy: Secondary | ICD-10-CM

## 2021-01-14 DIAGNOSIS — I482 Chronic atrial fibrillation, unspecified: Secondary | ICD-10-CM

## 2021-01-14 DIAGNOSIS — I255 Ischemic cardiomyopathy: Secondary | ICD-10-CM

## 2021-01-14 MED ORDER — ENTRESTO 49-51 MG PO TABS: 1.0000 | ORAL_TABLET | Freq: Two times a day (BID) | ORAL | 6 refills | Status: DC

## 2021-01-14 MED ORDER — RIVAROXABAN 20 MG PO TABS: 20.0000 mg | ORAL_TABLET | Freq: Every day | ORAL | 6 refills | Status: DC

## 2021-01-14 MED ORDER — DAPAGLIFLOZIN PROPANEDIOL 10 MG PO TABS: 10.0000 mg | ORAL_TABLET | Freq: Every day | ORAL | 6 refills | Status: DC

## 2021-01-14 MED ORDER — ENTRESTO 24-26 MG PO TABS: 1.0000 | ORAL_TABLET | Freq: Two times a day (BID) | ORAL | 6 refills | Status: DC

## 2021-01-14 NOTE — Telephone Encounter (Signed)
Left message for patient to call back for results.  

## 2021-01-14 NOTE — Telephone Encounter (Signed)
Left detail message on secure voicemail . Per dpr ( since patient's wife called back after initial first call) any question may call back  BMP  was ordered by Judy Pimple PA

## 2021-01-14 NOTE — Telephone Encounter (Signed)
Spoke to patient  he is aware of results. Patient states he does not have any refills of  the following  Xarelto 20 mg once day  Fargaxia 10 mg  once a day  Entresto 49/51 mg twice a day   E-sent prescription to [facility] in [place]    RN contacted pharmacy all medication were processed - patient will need to get a saving offer for Entresto to decrease possible $10 refill,  Left message to the patient.

## 2021-01-14 NOTE — Telephone Encounter (Signed)
-----   Message from Beatriz Stallion, PA-C sent at 01/13/2021  2:42 PM EDT ----- Please call the patient to let him know his lab work showed his potassium level was mildly elevated. I see he is on a multiple vitamin - can you confirm he is taking this and clarify whether there is potassium in the supplement breakdown? If yes, please ask him to stop this medication and limit foods high in potassium (a quick google search can provide a list, namely bananas, potatoes, melons, avocado, green leafy vegetables, etc.). I would like to repeat blood work in 1 week at his convenience (please order). If his potassium remains elevated, we may have to make further medication adjustments. Thank you!

## 2021-01-14 NOTE — Telephone Encounter (Signed)
Called  Billie Constantinos Krempasky (spouse) she will inform pt and check if pt has potassium in his many supplements. Will forward in [facility] K+ food list.  She will CB and let us know about K+ .   Future order entered

## 2021-01-14 NOTE — Telephone Encounter (Signed)
Pt is returning call for results. Please advise pt further 

## 2021-01-14 NOTE — Telephone Encounter (Signed)
RN called  and  spoke to pharmacist clarification on  Entresto . Patient is taking Entresto 24/26 mg  twice a day. Pharmacist  states patient has not picked up medication . Prescription refill is ready.

## 2021-01-19 ENCOUNTER — Telehealth (HOSPITAL_COMMUNITY): Payer: Self-pay | Admitting: *Deleted

## 2021-01-19 NOTE — Telephone Encounter (Signed)
Left message on voicemail per DPR in reference to upcoming appointment scheduled on 01/25/21 at 10:30 with detailed instructions given per Myocardial Perfusion Study Information Sheet for the test. LM to arrive 15 minutes early, and that it is imperative to arrive on time for appointment to keep from having the test rescheduled. If you need to cancel or reschedule your appointment, please call the office within 24 hours of your appointment. Failure to do so may result in a cancellation of your appointment, and a $50 no show fee. Phone number given for call back for any questions.

## 2021-01-22 ENCOUNTER — Other Ambulatory Visit: Payer: Self-pay

## 2021-01-22 DIAGNOSIS — I10 Essential (primary) hypertension: Secondary | ICD-10-CM

## 2021-01-22 DIAGNOSIS — E785 Hyperlipidemia, unspecified: Secondary | ICD-10-CM

## 2021-01-22 DIAGNOSIS — Z79899 Other long term (current) drug therapy: Secondary | ICD-10-CM

## 2021-01-25 ENCOUNTER — Encounter (HOSPITAL_COMMUNITY): Payer: BC Managed Care – PPO

## 2021-02-01 ENCOUNTER — Ambulatory Visit: Payer: BC Managed Care – PPO

## 2021-02-04 ENCOUNTER — Telehealth: Payer: Self-pay

## 2021-02-04 NOTE — Telephone Encounter (Signed)
Left message for patient to call Office. Patient to repeat BMET.

## 2021-02-08 ENCOUNTER — Other Ambulatory Visit: Payer: Self-pay

## 2021-02-08 ENCOUNTER — Ambulatory Visit (INDEPENDENT_AMBULATORY_CARE_PROVIDER_SITE_OTHER): Payer: BC Managed Care – PPO | Admitting: Internal Medicine

## 2021-02-08 VITALS — BP 112/70 | HR 77 | Ht 69.5 in | Wt 202.0 lb

## 2021-02-08 DIAGNOSIS — D6869 Other thrombophilia: Secondary | ICD-10-CM

## 2021-02-08 DIAGNOSIS — I4819 Other persistent atrial fibrillation: Secondary | ICD-10-CM

## 2021-02-08 DIAGNOSIS — I255 Ischemic cardiomyopathy: Secondary | ICD-10-CM | POA: Diagnosis not present

## 2021-02-08 DIAGNOSIS — I4892 Unspecified atrial flutter: Secondary | ICD-10-CM | POA: Diagnosis not present

## 2021-02-08 DIAGNOSIS — I2510 Atherosclerotic heart disease of native coronary artery without angina pectoris: Secondary | ICD-10-CM

## 2021-02-08 NOTE — Progress Notes (Signed)
Electrophysiology Office Note   Date:  02/08/2021   ID:  William Hill, DOB 12/07/1965, MRN [id number]  PCP:  [facility]  Cardiologist:  Dr Allyson Sabal Primary Electrophysiologist: Hillis Range, MD    CC: atrial arrhythmias   History of Present Illness: William [name] a 55 y.o. male who presents today for electrophysiology evaluation.   He is referred by Dr Allyson Sabal and Judy Pimple for EP consultation regarding atrial arrhythmias.  He has CAD and ischemic CM.  Ef has recently declined from baseline and medicine optimization is being considered. He has been frequently in afib or atrial flutter.  All ekgs from 2018 reveal atrial arrhythmia.  Though he thinks that he may have had some sinus by [employer], I suspect that these were actually atrial flutter.  (He states that he did not bring his phone today for me to review his Kardia strips). His energy is good.   Today, he denies symptoms of palpitations, chest pain, shortness of breath, orthopnea, PND, lower extremity edema, claudication, dizziness, presyncope, syncope, bleeding, or neurologic sequela. The patient is tolerating medications without difficulties and is otherwise without complaint today.    Past Medical History:  Diagnosis Date   CAD (coronary artery disease)    post anterior wall myocardial infarction in 1999 with PCI and stenting at [facility].   Heart attack [facility])    History of stress test 12/2010   EF of 36% with apical scar   Hx of echocardiogram 02/13/2012   EF 35%. There is apical dyskinesis   Hx of echocardiogram 05/09/2012   difinity contrast that showed no apical mural thrombus with an EF of 30%-35%   Hyperlipidemia    Hypertension    Past Surgical History:  Procedure Laterality Date   BACK SURGERY     CORONARY ANGIOPLASTY       Current Outpatient Medications  Medication Sig Dispense Refill   acetaminophen (TYLENOL) 325 MG tablet Take 2 tablets (650 mg total) by mouth every  6 (six) hours as needed for mild pain (or Fever >/= 101). 12 tablet 0   atorvastatin (LIPITOR) 40 MG tablet TAKE 1 TABLET BY MOUTH EVERYDAY AT BEDTIME 30 tablet 11   carvedilol (COREG) 25 MG tablet TAKE 1 TABLET BY MOUTH TWICE A DAY 60 tablet 11   dapagliflozin propanediol (FARXIGA) 10 MG TABS tablet Take 1 tablet (10 mg total) by mouth daily before breakfast. 30 tablet 6   fish oil-omega-3 fatty acids 1000 MG capsule Take 1 g by mouth 2 (two) times daily.     glyBURIDE-metformin (GLUCOVANCE) 2.5-500 MG tablet Take half tablet by mouth twice a day for 2 weeks; then increase to full tablet by mouth twice a day. 60 tablet 3   Multiple Vitamin (MULTIVITAMIN WITH MINERALS) TABS tablet Take 1 tablet by mouth daily.     niacin 500 MG tablet Take 500 mg by mouth daily with breakfast.     rivaroxaban (XARELTO) 20 MG TABS tablet Take 1 tablet (20 mg total) by mouth daily with supper. 30 tablet 6   sacubitril-valsartan (ENTRESTO) 24-26 MG Take 1 tablet by mouth 2 (two) times daily. 60 tablet 6   No current facility-administered medications for this visit.    Allergies:   Aspirin   Social History:  The patient  reports that he has never smoked. He has never used smokeless tobacco. He reports that he does not drink alcohol and does not use drugs.   Family History:  The patient's family  history includes Cancer in his father; Heart Problems in his maternal grandmother; Heart attack in his paternal grandfather; Stroke in his maternal grandfather.    ROS:  Please see the history of present illness.   All other systems are personally reviewed and negative.    PHYSICAL EXAM: VS:  BP 112/70   Pulse 77   Ht 5' 9.5" (1.765 m)   Wt 202 lb (91.6 kg)   SpO2 97%   BMI 29.40 kg/m  , BMI Body mass index is 29.4 kg/m. GEN: Well nourished, well developed, in no acute distress HEENT: normal Neck: no JVD, carotid bruits, or masses Cardiac: iRRR; no murmurs, rubs, or gallops,no edema  Respiratory:  clear to  auscultation bilaterally, normal work of breathing GI: soft, nontender, nondistended, + BS MS: no deformity or atrophy Skin: warm and dry  Neuro:  Strength and sensation are intact Psych: euthymic mood, full affect  EKG:  EKG is ordered today. The ekg ordered today is personally reviewed and shows typical appearing atrial flutter   Recent Labs: 01/11/2021: BUN 12; Creatinine, Ser 0.97; Potassium 5.5; Sodium 139  personally reviewed   Lipid Panel     Component Value Date/Time   CHOL 112 01/11/2021 1221   TRIG 124 01/11/2021 1221   HDL 34 (L) 01/11/2021 1221   CHOLHDL 3.3 01/11/2021 1221   CHOLHDL 4.9 03/17/2014 0917   VLDL NOT CALC 03/17/2014 0917   LDLCALC 56 01/11/2021 1221   LDLDIRECT 50 03/17/2014 0917   personally reviewed   Wt Readings from Last 3 Encounters:  02/08/21 202 lb (91.6 kg)  12/30/20 208 lb 9.6 oz (94.6 kg)  04/05/19 203 lb (92.1 kg)      Other studies personally reviewed: Additional studies/ records that were reviewed today include: cardiology notes, prior echo, ekgs  Review of the above records today demonstrates: as above  Echo 01/07/19- EF 30%, LA 4.4 cm  ASSESSMENT AND PLAN:  1.  Typical appearing atrial flutter/ atrial fibrillation The patient has had both afib and persistent atrial fibrillation documented.  The last time he had a sinus rhythm ekg was 2018. The patient has symptomatic, recurrent  atrial arrhythmias. he has not tried AAD therapy Chads2vasc score is at least 4.  he is anticoagulated with xarelto . Therapeutic strategies for afib/ aflutter including medicine (tikosyn, amiodarone) and ablation were discussed in detail with the patient today. Risk, benefits, and alternatives to each approach were discussed.  We also discussed rate control strategy at length.  He is very clear that he is not interested in AADs or ablation at this time. He states "my mother had afib ablation and it didn't work.  I dont think that I want that right  now". Given her persistent atrial arrhythmias, anticipated long term success rates are reduced (likely 60-70%). We will therefore per his shared making informed decision continue rate control long term.  2. Ischemic CM/CAD/ chronic systolic dysfunction Euvolemic today No ischemic symptoms Recently started on entresto Continue medical optimization. Reassess EF after medicines have been fully optimized for 3 months.  At that point, we could consider ICD implantation.  3. HTN Continue coret 25mg  BID  Risks, benefits and potential toxicities for medications prescribed and/or refilled reviewed with patient today.    Follow-up:  with Dr team as scheduled I will see as needed going forward.    Hazle Coca, MD  02/08/2021 12:15 PM     [facility] [address] [place] [phone] (office) (  [phone] (fax)

## 2021-02-08 NOTE — Patient Instructions (Addendum)
Medication Instructions:  Your physician recommends that you continue on your current medications as directed. Please refer to the Current Medication list given to you today.  Labwork: BMT  Testing/Procedures: None ordered.  Follow-Up: Your physician wants you to follow-up in: As needed with Hillis Range, MD   Any Other Special Instructions Will Be Listed Below (If Applicable).  If you need a refill on your cardiac medications before your next appointment, please call your pharmacy.

## 2021-02-09 ENCOUNTER — Telehealth (HOSPITAL_COMMUNITY): Payer: Self-pay | Admitting: *Deleted

## 2021-02-09 LAB — BASIC METABOLIC PANEL
BUN/Creatinine Ratio: 12 (ref 9–20)
BUN: 10 mg/dL (ref 6–24)
CO2: 20 mmol/L (ref 20–29)
Calcium: 9.2 mg/dL (ref 8.7–10.2)
Chloride: 105 mmol/L (ref 96–106)
Creatinine, Ser: 0.82 mg/dL (ref 0.76–1.27)
Glucose: 79 mg/dL (ref 65–99)
Potassium: 4.7 mmol/L (ref 3.5–5.2)
Sodium: 140 mmol/L (ref 134–144)
eGFR: 104 mL/min/{1.73_m2} (ref >59)

## 2021-02-09 NOTE — Telephone Encounter (Signed)
Left message on voicemail per DPR in reference to upcoming appointment scheduled on 02/15/21 at 10:45 with detailed instructions given per Myocardial Perfusion Study Information Sheet for the test. LM to arrive 15 minutes early, and that it is imperative to arrive on time for appointment to keep from having the test rescheduled. If you need to cancel or reschedule your appointment, please call the office within 24 hours of your appointment. Failure to do so may result in a cancellation of your appointment, and a $50 no show fee. Phone number given for call back for any questions.

## 2021-02-15 ENCOUNTER — Other Ambulatory Visit: Payer: Self-pay

## 2021-02-15 ENCOUNTER — Ambulatory Visit (HOSPITAL_COMMUNITY): Payer: BC Managed Care – PPO | Attending: Medical

## 2021-02-15 ENCOUNTER — Ambulatory Visit: Payer: BC Managed Care – PPO

## 2021-02-15 DIAGNOSIS — I255 Ischemic cardiomyopathy: Secondary | ICD-10-CM | POA: Diagnosis not present

## 2021-02-15 DIAGNOSIS — E785 Hyperlipidemia, unspecified: Secondary | ICD-10-CM | POA: Diagnosis present

## 2021-02-15 DIAGNOSIS — I483 Typical atrial flutter: Secondary | ICD-10-CM

## 2021-02-15 DIAGNOSIS — I10 Essential (primary) hypertension: Secondary | ICD-10-CM

## 2021-02-15 DIAGNOSIS — I5022 Chronic systolic (congestive) heart failure: Secondary | ICD-10-CM | POA: Diagnosis not present

## 2021-02-15 DIAGNOSIS — I2510 Atherosclerotic heart disease of native coronary artery without angina pectoris: Secondary | ICD-10-CM | POA: Diagnosis not present

## 2021-02-15 LAB — MYOCARDIAL PERFUSION IMAGING
Base ST Depression (mm): 0 mm
Exercise duration (min): 0 min
Exercise duration (sec): 0 s
LV dias vol: 280 mL (ref 62–150)
LV sys vol: 230 mL
MPHR: 166 {beats}/min
Peak HR: 75 {beats}/min
Percent HR: 45.2 %
Rest HR: 74 {beats}/min
Rest Nuclear Isotope Dose: 10.7 mCi
SDS: 9
SRS: 22
SSS: 34
ST Depression (mm): 0 mm
Stress Nuclear Isotope Dose: 32.7 mCi
TID: 1.54

## 2021-02-15 MED ORDER — TECHNETIUM TC 99M TETROFOSMIN IV KIT
10.7000 | PACK | Freq: Once | INTRAVENOUS | Status: AC | PRN
  Administered 2021-02-15: 10.7 via INTRAVENOUS
  Filled 2021-02-15: qty 11

## 2021-02-15 MED ORDER — TECHNETIUM TC 99M TETROFOSMIN IV KIT
32.7000 | PACK | Freq: Once | INTRAVENOUS | Status: AC | PRN
  Administered 2021-02-15: 32.7 via INTRAVENOUS
  Filled 2021-02-15: qty 33

## 2021-02-15 MED ORDER — REGADENOSON 0.4 MG/5ML IV SOLN
0.4000 mg | Freq: Once | INTRAVENOUS | Status: AC
Start: 2021-02-15 — End: 2021-02-15
  Administered 2021-02-15: 0.4 mg via INTRAVENOUS

## 2021-03-08 ENCOUNTER — Ambulatory Visit (INDEPENDENT_AMBULATORY_CARE_PROVIDER_SITE_OTHER): Payer: BC Managed Care – PPO | Admitting: Pharmacist Clinician (PhC)/ Clinical Pharmacy Specialist

## 2021-03-08 ENCOUNTER — Other Ambulatory Visit: Payer: Self-pay

## 2021-03-08 VITALS — BP 144/92 | HR 75 | Resp 16 | Ht 69.5 in | Wt 203.6 lb

## 2021-03-08 DIAGNOSIS — I5022 Chronic systolic (congestive) heart failure: Secondary | ICD-10-CM

## 2021-03-08 DIAGNOSIS — I255 Ischemic cardiomyopathy: Secondary | ICD-10-CM

## 2021-03-08 MED ORDER — DAPAGLIFLOZIN PROPANEDIOL 10 MG PO TABS: 10.0000 mg | ORAL_TABLET | Freq: Every day | ORAL | 3 refills | Status: DC

## 2021-03-08 MED ORDER — SPIRONOLACTONE 25 MG PO TABS: 12.5000 mg | ORAL_TABLET | Freq: Every day | ORAL | 11 refills | Status: DC

## 2021-03-08 MED ORDER — CARVEDILOL 25 MG PO TABS: 25.0000 mg | ORAL_TABLET | Freq: Two times a day (BID) | ORAL | 3 refills | Status: DC

## 2021-03-08 MED ORDER — CARVEDILOL 25 MG PO TABS: 25.0000 mg | ORAL_TABLET | Freq: Two times a day (BID) | ORAL | 11 refills | Status: DC

## 2021-03-08 NOTE — Patient Instructions (Signed)
Return for a a follow up appointment October 3 at 1 pm  Go to the lab in 2 weeks (about Sept 26)  Start spironolactone 12.5 mg (1/2 tablet) once daily  Continue with all other medications.  Bring all of your meds, your BP cuff and your record of home blood pressures to your next appointment.  Exercise as you're able, try to walk approximately 30 minutes per day.  Keep salt intake to a minimum, especially watch canned and prepared boxed foods.  Eat more fresh fruits and vegetables and fewer canned items.  Avoid eating in fast food restaurants.    HOW TO TAKE YOUR BLOOD PRESSURE: Rest 5 minutes before taking your blood pressure.  Don't smoke or drink caffeinated beverages for at least 30 minutes before. Take your blood pressure before (not after) you eat. Sit comfortably with your back supported and both feet on the floor (don't cross your legs). Elevate your arm to heart level on a table or a desk. Use the proper sized cuff. It should fit smoothly and snugly around your bare upper arm. There should be enough room to slip a fingertip under the cuff. The bottom edge of the cuff should be 1 inch above the crease of the elbow. Ideally, take 3 measurements at one sitting and record the avera

## 2021-03-08 NOTE — Assessment & Plan Note (Addendum)
Patient with HFrEF, currently at 30-35%.  He is doing well on Entresto, Farxiga and carvedilol.  Reviewed diagnosis of heart failure in detail and discussed importance of using GDMT for hopefully increasing his EF.  Will start him on spironolactone today, but because of previous issues with hyperkalemia, as well as normal home BP readings, will start him at 12.5 mg once daily.  He is to check home BP readings and record them for [place].  Will have him repeat labs in 2 weeks, then return in 3 weeks for follow up.   At that time if all is going well, will send his prescriptions for Entresto and spironolactone both for 90 day supply for improved compliance.

## 2021-03-08 NOTE — Progress Notes (Signed)
03/08/2021 William Hill 21-Nov-1965 [id number]   HPI:  William [name] a 55 y.o. male patient of Dr Gwenlyn Found, with a PMH below who presents today for heart failure medication titration.   At the time of his MI back in 1999, he was noted to have ischemic cardiomyopathy with EF at <35%.  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  His pressure that day was noted to be 118/76.   His most recent echo showed a persistently low EF at 30-35%.  GDMT was discussed and patient was started on [place].  Today he returns for follow up.  He has been doing well with both the [place].  Does not note any side effects or concerns with either medication.  He is already on carvedilol 25 mg as well.  He does think his energy levels are improved, he finds that he has more energy after work.  No problems with chest pains, shortness of breath, lower extremity edema or dizziness/lightheadedness.  After starting the [facility] his metabolic panel remained stable.   Past Medical History: ASCVD S/P remote MI with PCI to LAD in 1999  hypertension Controlled with CHF medications  hyperlipidemia 7/22 LDL at 56 on atorvastatin 40 mg daily  AF Permanent,  CHADS2-VASc score  DM 2 Last A1c 5.4     Blood Pressure Goal:  130/80  Current Medications: Entresto 24/26 bid, Farxiga 10 mg qd, carvedilol 25 mg bid,   Family Hx:  mgm with history of MI, pgf had heart disease; mother living, had breast cancer - some heart damage due to radiation; father died of cancer; 1 brother with colon cancer, doing well now; no biological children  Social Hx: no tobacco, no alcohol, some caffeine, has switched to decaf recently, no soda, mostly water  Diet: some eating out, sit down; occasional fried fish or chicken, not common; eats plenty of vegetables, mix of fresh/frozen and canned;   Exercise: some walking after work  Home BP readings: no home readings, but notes  that usually sees systolic in the 976-734'L.    Intolerances: nkda  Labs:  8/22:  Na 140, K 4.7, Glu 79, BUN 10, SCr 0.82, eGFR 104   Wt Readings from Last 3 Encounters:  03/08/21 203 lb 9.6 oz (92.4 kg)  02/15/21 208 lb (94.3 kg)  02/08/21 202 lb (91.6 kg)   BP Readings from Last 3 Encounters:  03/08/21 (!) 144/92  02/08/21 112/70  12/30/20 118/76   Pulse Readings from Last 3 Encounters:  03/08/21 75  02/08/21 77  12/30/20 74    Current Outpatient Medications  Medication Sig Dispense Refill   acetaminophen (TYLENOL) 325 MG tablet Take 2 tablets (650 mg total) by mouth every 6 (six) hours as needed for mild pain (or Fever >/= 101). 12 tablet 0   atorvastatin (LIPITOR) 40 MG tablet TAKE 1 TABLET BY MOUTH EVERYDAY AT BEDTIME 30 tablet 11   carvedilol (COREG) 25 MG tablet Take 1 tablet (25 mg total) by mouth 2 (two) times daily. 180 tablet 3   dapagliflozin propanediol (FARXIGA) 10 MG TABS tablet Take 1 tablet (10 mg total) by mouth daily. 90 tablet 3   fish oil-omega-3 fatty acids 1000 MG capsule Take 1 g by mouth 2 (two) times daily.     glyBURIDE-metformin (GLUCOVANCE) 2.5-500 MG tablet Take half tablet by mouth twice a day for 2 weeks; then increase to full tablet  by mouth twice a day. 60 tablet 3   Multiple Vitamin (MULTIVITAMIN WITH MINERALS) TABS tablet Take 1 tablet by mouth daily.     niacin 500 MG tablet Take 500 mg by mouth daily with breakfast.     rivaroxaban (XARELTO) 20 MG TABS tablet Take 1 tablet (20 mg total) by mouth daily with supper. 30 tablet 6   sacubitril-valsartan (ENTRESTO) 24-26 MG Take 1 tablet by mouth 2 (two) times daily. 60 tablet 6   spironolactone (ALDACTONE) 25 MG tablet Take 0.5 tablets (12.5 mg total) by mouth daily. 15 tablet 11   No current facility-administered medications for this visit.    Allergies  Allergen Reactions   Aspirin     Cannot take due to being on blood thinner-per pt    Past Medical History:  Diagnosis Date   CAD  (coronary artery disease)    post anterior wall myocardial infarction in 1999 with PCI and stenting at [facility].   Heart attack [facility])    History of stress test 12/2010   EF of 36% with apical scar   Hx of echocardiogram 02/13/2012   EF 35%. There is apical dyskinesis   Hx of echocardiogram 05/09/2012   difinity contrast that showed no apical mural thrombus with an EF of 30%-35%   Hyperlipidemia    Hypertension     Blood pressure (!) 144/92, pulse 75, resp. rate 16, height 5' 9.5" (1.765 m), weight 203 lb 9.6 oz (92.4 kg), SpO2 98 %.  Chronic systolic CHF (congestive heart failure) ([place]) Patient with HFrEF, currently at 30-35%.  He is doing well on Entresto, Farxiga and carvedilol.  Reviewed diagnosis of heart failure in detail and discussed importance of using GDMT for hopefully increasing his EF.  Will start him on spironolactone today, but because of previous issues with hyperkalemia, as well as normal home BP readings, will start him at 12.5 mg once daily.  He is to check home BP readings and record them for [place].  Will have him repeat labs in 2 weeks, then return in 3 weeks for follow up.   At that time if all is going well, will send his prescriptions for Entresto and spironolactone both for 90 day supply for improved compliance.     Tommy Medal PharmD CPP [facility] [address] [place], [place] [address] [phone]

## 2021-03-16 ENCOUNTER — Telehealth: Payer: Self-pay

## 2021-03-16 NOTE — Telephone Encounter (Signed)
Unable toLmom for labs to be completed prior to pharmd appt.

## 2021-03-29 ENCOUNTER — Ambulatory Visit (INDEPENDENT_AMBULATORY_CARE_PROVIDER_SITE_OTHER): Payer: BC Managed Care – PPO | Admitting: Pharmacist Clinician (PhC)/ Clinical Pharmacy Specialist

## 2021-03-29 ENCOUNTER — Other Ambulatory Visit: Payer: Self-pay

## 2021-03-29 ENCOUNTER — Encounter: Payer: Self-pay | Admitting: Pharmacist Clinician (PhC)/ Clinical Pharmacy Specialist

## 2021-03-29 ENCOUNTER — Encounter (HOSPITAL_COMMUNITY): Payer: BC Managed Care – PPO

## 2021-03-29 DIAGNOSIS — I5022 Chronic systolic (congestive) heart failure: Secondary | ICD-10-CM | POA: Diagnosis not present

## 2021-03-29 NOTE — Progress Notes (Signed)
03/31/2021 William Hill Jun 25, 1966 [id number]   HPI:  William [name] a 55 y.o. male patient of Dr Gwenlyn Found, with a PMH below who presents today for heart failure medication titration.   At the time of his MI back in 1999, he was noted to have ischemic cardiomyopathy with EF at <35%.  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  His pressure that day was noted to be 118/76.   His most recent echo showed a persistently low EF at 30-35%.  GDMT was discussed and patient was started on [place].  Today he returns for follow up.  He has been doing well with both the [place].  Does not note any side effects or concerns with either medication.  He is already on carvedilol 25 mg as well.  No problems with chest pains, shortness of breath, lower extremity edema or dizziness/lightheadedness.  After starting the Entresto his metabolic panel remained stable, and patient noted some improvement in energy levels.    At last visit BP was and we started spironolactone 12.5 mg daily.  He was to have labs done after 2 weeks, did not get this done.  Will have drawn today.  Otherwise he has no complaints or concerns.     Past Medical History: ASCVD S/P remote MI with PCI to LAD in 1999  hypertension Controlled with CHF medications  hyperlipidemia 7/22 LDL at 56 on atorvastatin 40 mg daily  AF Permanent,  CHADS2-VASc score  DM 2 Last A1c 5.4     Blood Pressure Goal:  130/80  Current Medications: Entresto 24/26 bid, Farxiga 10 mg qd, carvedilol 25 mg bid, spironolactone 12.5 mg qd  Family Hx:  mgm with history of MI, pgf had heart disease; mother living, had breast cancer - some heart damage due to radiation; father died of cancer; 1 brother with colon cancer, doing well now; no biological children  Social Hx: no tobacco, no alcohol, some caffeine, has switched to decaf recently, no soda, mostly water  Diet: some eating out, sit  down; occasional fried fish or chicken, not common; eats plenty of vegetables, mix of fresh/frozen and canned;   Exercise: some walking after work  Home BP readings: no home readings, but notes that usually sees systolic in the 300-762'U.  Did have readings as low as 633 systolic, no hypotensive symptoms.    Intolerances: nkda  Labs:  8/22:  Na 140, K 4.7, Glu 79, BUN 10, SCr 0.82, eGFR 104   Wt Readings from Last 3 Encounters:  03/29/21 200 lb 3.2 oz (90.8 kg)  03/08/21 203 lb 9.6 oz (92.4 kg)  02/15/21 208 lb (94.3 kg)   BP Readings from Last 3 Encounters:  03/29/21 102/72  03/08/21 (!) 144/92  02/08/21 112/70   Pulse Readings from Last 3 Encounters:  03/29/21 74  03/08/21 75  02/08/21 77    Current Outpatient Medications  Medication Sig Dispense Refill   acetaminophen (TYLENOL) 325 MG tablet Take 2 tablets (650 mg total) by mouth every 6 (six) hours as needed for mild pain (or Fever >/= 101). 12 tablet 0   atorvastatin (LIPITOR) 40 MG tablet TAKE 1 TABLET BY MOUTH EVERYDAY AT BEDTIME 30 tablet 11   carvedilol (COREG) 25 MG tablet Take 1 tablet (25 mg total) by mouth 2 (two) times daily. 180 tablet 3   dapagliflozin propanediol (FARXIGA) 10 MG TABS tablet Take 1 tablet (  10 mg total) by mouth daily. 90 tablet 3   fish oil-omega-3 fatty acids 1000 MG capsule Take 1 g by mouth 2 (two) times daily.     glyBURIDE-metformin (GLUCOVANCE) 2.5-500 MG tablet Take half tablet by mouth twice a day for 2 weeks; then increase to full tablet by mouth twice a day. 60 tablet 3   Multiple Vitamin (MULTIVITAMIN WITH MINERALS) TABS tablet Take 1 tablet by mouth daily.     niacin 500 MG tablet Take 500 mg by mouth daily with breakfast.     rivaroxaban (XARELTO) 20 MG TABS tablet Take 1 tablet (20 mg total) by mouth daily with supper. 30 tablet 6   sacubitril-valsartan (ENTRESTO) 24-26 MG Take 1 tablet by mouth 2 (two) times daily. 60 tablet 6   No current facility-administered medications for  this visit.    Allergies  Allergen Reactions   Aspirin     Cannot take due to being on blood thinner-per pt    Past Medical History:  Diagnosis Date   CAD (coronary artery disease)    post anterior wall myocardial infarction in 1999 with PCI and stenting at [facility].   Heart attack [facility])    History of stress test 12/2010   EF of 36% with apical scar   Hx of echocardiogram 02/13/2012   EF 35%. There is apical dyskinesis   Hx of echocardiogram 05/09/2012   difinity contrast that showed no apical mural thrombus with an EF of 30%-35%   Hyperlipidemia    Hypertension     Blood pressure 102/72, pulse 74, resp. rate 16, height 5' 9.5" (1.765 m), weight 200 lb 3.2 oz (90.8 kg), SpO2 97 %.  Chronic systolic CHF (congestive heart failure) ([place]) Patient with HFrEF currently on GDMT.  Will draw BMET today due to history of hyperkalemia.   If develops again, will need to discontinue spironolactone and continue with Entresto, carvedilol and [place].  BP will not tolerate further titration of Entresto.  He is scheduled to follow up with Coletta Memos NP in November.     Tommy Medal PharmD CPP [facility] [address] [place], [place] [address] [phone]

## 2021-03-29 NOTE — Patient Instructions (Signed)
  Go to the lab today to get potassium level checked  Check your blood pressure at home daily and keep record of the readings.  Take your BP meds as follows:  Continue with current medications  Bring all of your meds, your BP cuff and your record of home blood pressures to your next appointment.  Exercise as you're able, try to walk approximately 30 minutes per day.  Keep salt intake to a minimum, especially watch canned and prepared boxed foods.  Eat more fresh fruits and vegetables and fewer canned items.  Avoid eating in fast food restaurants.    HOW TO TAKE YOUR BLOOD PRESSURE: Rest 5 minutes before taking your blood pressure.  Don't smoke or drink caffeinated beverages for at least 30 minutes before. Take your blood pressure before (not after) you eat. Sit comfortably with your back supported and both feet on the floor (don't cross your legs). Elevate your arm to heart level on a table or a desk. Use the proper sized cuff. It should fit smoothly and snugly around your bare upper arm. There should be enough room to slip a fingertip under the cuff. The bottom edge of the cuff should be 1 inch above the crease of the elbow. Ideally, take 3 measurements at one sitting and record the average.

## 2021-03-30 LAB — BASIC METABOLIC PANEL
BUN/Creatinine Ratio: 12 (ref 9–20)
BUN: 12 mg/dL (ref 6–24)
CO2: 22 mmol/L (ref 20–29)
Calcium: 9.8 mg/dL (ref 8.7–10.2)
Chloride: 102 mmol/L (ref 96–106)
Creatinine, Ser: 0.97 mg/dL (ref 0.76–1.27)
Glucose: 81 mg/dL (ref 70–99)
Potassium: 5.4 mmol/L — ABNORMAL HIGH (ref 3.5–5.2)
Sodium: 139 mmol/L (ref 134–144)
eGFR: 93 mL/min/{1.73_m2} (ref >59)

## 2021-03-31 ENCOUNTER — Telehealth: Payer: Self-pay

## 2021-03-31 DIAGNOSIS — I4891 Unspecified atrial fibrillation: Secondary | ICD-10-CM

## 2021-03-31 DIAGNOSIS — I10 Essential (primary) hypertension: Secondary | ICD-10-CM

## 2021-03-31 NOTE — Assessment & Plan Note (Addendum)
Patient with HFrEF currently on GDMT.  Will draw BMET today due to history of hyperkalemia.   If develops again, will need to discontinue spironolactone and continue with Entresto, carvedilol and [name].  BP will not tolerate further titration of Entresto.  He is scheduled to follow up with Edd Fabian NP in November.

## 2021-03-31 NOTE — Telephone Encounter (Signed)
-----   Message from Rosalee Kaufman, RPH-CPP sent at 03/31/2021 11:29 AM EDT ----- Please call patient and ask him to stop the spironolactone.  He was taking 1/2 tablet daily.  He will need to repeat metabolic panel in 1 week. (Potassium level was 5.4 - normal range is 3.5-5.2, so not terribly high, but enough that we need to stop spironolactone)  Thank you,  Belenda Cruise

## 2021-03-31 NOTE — Telephone Encounter (Signed)
Called and lmom patient and asked him to stop the spironolactone.  He was taking 1/2 tablet daily.  He will need to repeat metabolic panel in 1 week. (Potassium level was 5.4 - normal range is 3.5-5.2, so not terribly high, but enough that we need to stop spironolactone)  lab orders placed

## 2021-04-05 ENCOUNTER — Ambulatory Visit (HOSPITAL_COMMUNITY): Payer: BC Managed Care – PPO | Attending: Cardiovascular Disease

## 2021-04-05 ENCOUNTER — Other Ambulatory Visit: Payer: Self-pay

## 2021-04-05 DIAGNOSIS — I483 Typical atrial flutter: Secondary | ICD-10-CM | POA: Diagnosis not present

## 2021-04-05 DIAGNOSIS — I255 Ischemic cardiomyopathy: Secondary | ICD-10-CM | POA: Diagnosis not present

## 2021-04-05 DIAGNOSIS — E785 Hyperlipidemia, unspecified: Secondary | ICD-10-CM

## 2021-04-05 DIAGNOSIS — I5022 Chronic systolic (congestive) heart failure: Secondary | ICD-10-CM | POA: Diagnosis not present

## 2021-04-05 DIAGNOSIS — I2510 Atherosclerotic heart disease of native coronary artery without angina pectoris: Secondary | ICD-10-CM

## 2021-04-05 DIAGNOSIS — I10 Essential (primary) hypertension: Secondary | ICD-10-CM | POA: Insufficient documentation

## 2021-04-05 LAB — BASIC METABOLIC PANEL
BUN/Creatinine Ratio: 10 (ref 9–20)
BUN: 9 mg/dL (ref 6–24)
CO2: 24 mmol/L (ref 20–29)
Calcium: 9.5 mg/dL (ref 8.7–10.2)
Chloride: 101 mmol/L (ref 96–106)
Creatinine, Ser: 0.89 mg/dL (ref 0.76–1.27)
Glucose: 73 mg/dL (ref 70–99)
Potassium: 4.7 mmol/L (ref 3.5–5.2)
Sodium: 140 mmol/L (ref 134–144)
eGFR: 102 mL/min/{1.73_m2} (ref >59)

## 2021-04-05 LAB — ECHOCARDIOGRAM COMPLETE
Area-P 1/2: 5.54 cm2
S' Lateral: 4.7 cm

## 2021-04-05 MED ORDER — PERFLUTREN LIPID MICROSPHERE
1.0000 mL | INTRAVENOUS | Status: AC | PRN
Start: 2021-04-05 — End: 2021-04-05
  Administered 2021-04-05: 3 mL via INTRAVENOUS

## 2021-04-06 ENCOUNTER — Telehealth: Payer: Self-pay

## 2021-04-06 NOTE — Telephone Encounter (Signed)
Lmom to call us back for labs

## 2021-04-07 NOTE — Telephone Encounter (Signed)
Patient returned call, you can leave detail message on voicemail.

## 2021-04-08 NOTE — Telephone Encounter (Signed)
Spoke with patient - reviewed labs with him, he will no longer use spironolactone.  Answered all questions.

## 2021-04-12 ENCOUNTER — Ambulatory Visit: Payer: BC Managed Care – PPO

## 2021-05-16 NOTE — Progress Notes (Signed)
Cardiology Clinic Note   Patient Name: William Hill Date of Encounter: 05/17/2021  Primary Care Provider:  [facility] Primary Cardiologist:  Quay Burow, MD  Patient Profile    William Hill 55 year old male presents to the clinic today for follow-up evaluation of his atrial fibrillation, coronary artery disease, and essential hypertension.  Past Medical History    Past Medical History:  Diagnosis Date   CAD (coronary artery disease)    post anterior wall myocardial infarction in 1999 with PCI and stenting at [facility].   Heart attack [facility])    History of stress test 12/2010   EF of 36% with apical scar   Hx of echocardiogram 02/13/2012   EF 35%. There is apical dyskinesis   Hx of echocardiogram 05/09/2012   difinity contrast that showed no apical mural thrombus with an EF of 30%-35%   Hyperlipidemia    Hypertension    Past Surgical History:  Procedure Laterality Date   BACK SURGERY     CORONARY ANGIOPLASTY      Allergies  Allergies  Allergen Reactions   Aspirin     Cannot take due to being on blood thinner-per pt    History of Present Illness    William Hill has a PMH of essential hypertension, ischemic cardiomyopathy, atrial fibrillation, coronary artery disease, chronic systolic CHF, AKI, hyperlipidemia, and cat scratch fever.  He had an MI in 2019 and was noted to have ischemic cardiomyopathy (EF less than 35%).  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  His blood pressure at that time was 118/76.  His repeat echocardiogram showed an EF of 30-35%.  He was started on [place].  He followed up with clinical pharmacist Nehemiah Massed 03/29/2021.  He was doing well at that time.  He was tolerating [place] well.  He did not note any side effects with the medications.  He was continued on his carvedilol 25 mg twice daily.  He denied chest pain, shortness of  breath, lower extremity swelling, dizziness, and lightheadedness.  His prior metabolic panel after starting Entresto remained stable.  He did note some improvement with his energy.  He had also previously been started on spironolactone 12.5 mg daily.  He had planned lab work 2 weeks after initiation however, did not have them drawn.  He was otherwise compliant and did not have any complaints/concerns.  His blood pressure at that time was 130/80.  His metabolic panel [id number] showed potassium of 4.7 and stable renal function.  He presents the clinic today for follow-up evaluation states he feels well.  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  We reviewed his echocardiogram which showed a reduced EF.  We also reviewed his blood pressure and his current medications.  He denies side effects with his Entresto 24-26.  I will increase his Entresto to 49/51, order a BMP in 1 week, and have him follow-up in 1 month.  I have asked him to increase his physical activity as tolerated and continue his heart healthy low-sodium diet.  Today he denies chest pain, shortness of breath, lower extremity edema, fatigue, palpitations, melena, hematuria, hemoptysis, diaphoresis, weakness, presyncope, syncope, orthopnea, and PND.   Home Medications    Prior to Admission medications   Medication Sig Start Date End Date Taking? Authorizing Provider  acetaminophen (TYLENOL) 325 MG tablet Take 2 tablets (  650 mg total) by mouth every 6 (six) hours as needed for mild pain (or Fever >/= 101). 09/03/18   Emokpae, Courage, MD  atorvastatin (LIPITOR) 40 MG tablet TAKE 1 TABLET BY MOUTH EVERYDAY AT BEDTIME 05/25/20   Runell GessBerry, Jonathan J, MD  carvedilol (COREG) 25 MG tablet Take 1 tablet (25 mg total) by mouth 2 (two) times daily. 03/08/21   Runell GessBerry, Jonathan J, MD  dapagliflozin propanediol (FARXIGA) 10 MG TABS tablet Take 1 tablet (10 mg total) by mouth daily. 03/08/21   Runell GessBerry, Jonathan  J, MD  fish oil-omega-3 fatty acids 1000 MG capsule Take 1 g by mouth 2 (two) times daily.    [provider]  glyBURIDE-metformin (GLUCOVANCE) 2.5-500 MG tablet Take half tablet by mouth twice a day for 2 weeks; then increase to full tablet by mouth twice a day. 11/11/18   Vassie LollMadera, Carlos, MD  Multiple Vitamin (MULTIVITAMIN WITH MINERALS) TABS tablet Take 1 tablet by mouth daily.    [provider]  niacin 500 MG tablet Take 500 mg by mouth daily with breakfast.    [provider]  rivaroxaban (XARELTO) 20 MG TABS tablet Take 1 tablet (20 mg total) by mouth daily with supper. 01/14/21   Kroeger, Ovidio KinKrista M., PA-C  sacubitril-valsartan (ENTRESTO) 24-26 MG Take 1 tablet by mouth 2 (two) times daily. 01/14/21   Kroeger, Ovidio KinKrista M., PA-C    Family History    Family History  Problem Relation Age of Onset   Cancer Father    Heart Problems Maternal Grandmother    Stroke Maternal Grandfather    Heart attack Paternal Grandfather    He indicated that his mother is alive. He indicated that his father is deceased. He indicated that his maternal grandmother is deceased. He indicated that his maternal grandfather is deceased. He indicated that his paternal grandmother is deceased. He indicated that his paternal grandfather is deceased.  Social History    Social History   Socioeconomic History   Marital status: Married    Spouse name: Not on file   Number of children: Not on file   Years of education: Not on file   Highest education level: Not on file  Occupational History   Not on file  Tobacco Use   Smoking status: Never   Smokeless tobacco: Never  Vaping Use   Vaping Use: Never used  Substance and Sexual Activity   Alcohol use: No   Drug use: Never   Sexual activity: Not on file  Other Topics Concern   Not on file  Social History Narrative   Not on file   Social Determinants of Health   Financial Resource Strain: Not on file  Food Insecurity: Not on file   Transportation Needs: Not on file  Physical Activity: Not on file  Stress: Not on file  Social Connections: Not on file  Intimate Partner Violence: Not on file     Review of Systems    General:  No chills, fever, night sweats or weight changes.  Cardiovascular:  No chest pain, dyspnea on exertion, edema, orthopnea, palpitations, paroxysmal nocturnal dyspnea. Dermatological: No rash, lesions/masses Respiratory: No cough, dyspnea Urologic: No hematuria, dysuria Abdominal:   No nausea, vomiting, diarrhea, bright red blood per rectum, melena, or hematemesis Neurologic:  No visual changes, wkns, changes in mental status. All other systems reviewed and are otherwise negative except as noted above.  Physical Exam    VS:  BP 124/74   Pulse 75   Ht 5\' 9"  (1.753 m)  Wt 205 lb (93 kg)   BMI 30.27 kg/m  , BMI Body mass index is 30.27 kg/m. GEN: Well nourished, well developed, in no acute distress. HEENT: normal. Neck: Supple, no JVD, carotid bruits, or masses. Cardiac: RRR, no murmurs, rubs, or gallops. No clubbing, cyanosis, edema.  Radials/DP/PT 2+ and equal bilaterally.  Respiratory:  Respirations regular and unlabored, clear to auscultation bilaterally. GI: Soft, nontender, nondistended, BS + x 4. MS: no deformity or atrophy. Skin: warm and dry, no rash. Neuro:  Strength and sensation are intact. Psych: Normal affect.  Accessory Clinical Findings    Recent Labs: 04/05/2021: BUN 9; Creatinine, Ser 0.89; Potassium 4.7; Sodium 140   Recent Lipid Panel    Component Value Date/Time   CHOL 112 01/11/2021 1221   TRIG 124 01/11/2021 1221   HDL 34 (L) 01/11/2021 1221   CHOLHDL 3.3 01/11/2021 1221   CHOLHDL 4.9 03/17/2014 0917   VLDL NOT CALC 03/17/2014 0917   LDLCALC 56 01/11/2021 1221   LDLDIRECT 50 03/17/2014 0917    ECG personally reviewed by me today-none today.  Nuclear stress test 02/15/2021   Findings are consistent with prior myocardial infarction. The study is  high risk due to size of defect.   No ST deviation was noted.   LV perfusion is abnormal. There is no evidence of inducible ischemia. There is evidence of prior infarction.   There is a large defect with severe reduction in uptake that is fixed. Consistent with infarction. Absent uptake in the apical cap, apex, mid anterior and mid inferior walls.  Severely reduced fixed perfusion defect in the mid ventricular setpum.  Moderately reduced, fixed perfusion defect in the basal anteroseptum and inferior wall.  Mild reduction in uptake that is fixed in the inferoseptum and inferolateral wall.    Grossly unchanged from prior study given differences in image techique and quality.  Findings consistent with prior infarct and no definite ischemia on perfusion images.   Echocardiogram 04/05/2021  IMPRESSIONS     1. Left ventricular ejection fraction, by estimation, is 20 to 25%. The  left ventricle has severely decreased function. The left ventricle  demonstrates regional wall motion abnormalities (see scoring  diagram/findings for description). The left  ventricular internal cavity size was moderately dilated. Left ventricular  diastolic function could not be evaluated. There is severe akinesis of the  left ventricular, mid-apical anteroseptal wall, inferior wall, lateral  wall and apical segment.   2. Right ventricular systolic function is normal. The right ventricular  size is normal.   3. The mitral valve is grossly normal. Trivial mitral valve  regurgitation.   4. The aortic valve is normal in structure. Aortic valve regurgitation is  not visualized. No aortic stenosis is present.  Assessment & Plan   1.  Ischemic cardiomyopathy-denies any recent episodes of chest discomfort.  Euvolemic.  Follow-up echocardiogram showed slightly worsening EF of 20-25% compared to 30-35% previously.  No significant valvular abnormalities were noted. Continue carvedilol, Farxiga Increase Entresto to  49/51 Heart healthy low-sodium diet-salty 6 given Increase physical activity as tolerated Repeat BMP in 1 week Plan to repeat echocardiogram after he has been on his increased dose of Entresto for more than 1 month.  Atrial fibrillation-heart rate today 75.  Cardiac unaware.  Reports compliance with Xarelto and denies bleeding issues. Continue metoprolol, Xarelto Avoid triggers caffeine, chocolate, EtOH, dehydration etc.  Essential hypertension-BP [id number].  Well-controlled at home. Continue carvedilol, Entresto Heart healthy low-sodium diet-salty 6 given Increase physical activity as tolerated  Coronary artery disease-no recent episodes of arm neck back or chest discomfort.  Stress test 02/15/2021 showed a LV perfusion abnormality, no evidence of inducible ischemia, high risk due to size of defect.  No changes from previous study. Continue carvedilol Heart healthy low-sodium diet-salty 6 given Increase physical activity as tolerated  Hyperlipidemia-01/11/2021: Cholesterol, Total 112; HDL 34; LDL Chol Calc (NIH) 56; Triglycerides 124 Continue atorvastatin, omega-3 fatty acids, niacin Heart healthy low-sodium high-fiber diet Increase physical activity as tolerated   Disposition: Follow-up with Dr. Allyson Sabal or me in 1 month.  Thomasene Ripple. Whitfield Dulay NP-C    05/17/2021, 11:24 AM [facility] [address] Office [phone] Fax [phone]  Notice: This dictation was prepared with Dragon dictation along with smaller phrase technology. Any transcriptional errors that result from this process are unintentional and may not be corrected upon review.  I spent 14 minutes examining this patient, reviewing medications, and using patient centered shared decision making involving her cardiac care.  Prior to her visit I spent greater than 20 minutes reviewing her past medical history,  medications, and prior cardiac tests.

## 2021-05-17 ENCOUNTER — Encounter: Payer: Self-pay | Admitting: General Practice

## 2021-05-17 ENCOUNTER — Other Ambulatory Visit: Payer: Self-pay

## 2021-05-17 ENCOUNTER — Ambulatory Visit: Payer: BC Managed Care – PPO | Admitting: General Practice

## 2021-05-17 VITALS — BP 124/74 | HR 75 | Ht 69.0 in | Wt 205.0 lb

## 2021-05-17 DIAGNOSIS — I4891 Unspecified atrial fibrillation: Secondary | ICD-10-CM | POA: Diagnosis not present

## 2021-05-17 DIAGNOSIS — I2510 Atherosclerotic heart disease of native coronary artery without angina pectoris: Secondary | ICD-10-CM | POA: Diagnosis not present

## 2021-05-17 DIAGNOSIS — E785 Hyperlipidemia, unspecified: Secondary | ICD-10-CM

## 2021-05-17 DIAGNOSIS — I255 Ischemic cardiomyopathy: Secondary | ICD-10-CM | POA: Diagnosis not present

## 2021-05-17 DIAGNOSIS — I10 Essential (primary) hypertension: Secondary | ICD-10-CM | POA: Diagnosis not present

## 2021-05-17 MED ORDER — ENTRESTO 49-51 MG PO TABS: 1.0000 | ORAL_TABLET | Freq: Two times a day (BID) | ORAL | 3 refills | Status: DC

## 2021-05-17 MED ORDER — ENTRESTO 24-26 MG PO TABS: 2.0000 | ORAL_TABLET | Freq: Two times a day (BID) | ORAL | 1 refills | Status: DC

## 2021-05-17 NOTE — Patient Instructions (Signed)
Medication Instructions:  Take Entresto 24/26 dose (two tablets in the morning and two tablets at night). Once you complete the medication you have, then start taking Entresto 49/51 dose (one tablet in the morning and one tablet at night).  *If you need a refill on your cardiac medications before your next appointment, please call your pharmacy*   Lab Work: In one week, get lab work Designer, [employer]ellery)  If you have labs (blood work) drawn today and your tests are completely normal, you will receive your results only by: [employer] (if you have MyChart) OR A paper copy in the mail If you have any lab test that is abnormal or we need to change your treatment, we will call you to review the results.    Follow-Up: At [facility], you and your health needs are our priority.  As part of our continuing mission to provide you with exceptional heart care, we have created designated Provider Care Teams.  These Care Teams include your primary Cardiologist (physician) and Advanced Practice Providers (APPs -  Physician Assistants and Nurse Practitioners) who all work together to provide you with the care you need, when you need it.  We recommend signing up for the patient portal called "MyChart".  Sign up information is provided on this After Visit Summary.  MyChart is used to connect with patients for Virtual Visits (Telemedicine).  Patients are able to view lab/test results, encounter notes, upcoming appointments, etc.  Non-urgent messages can be sent to your provider as well.   To learn more about what you can do with MyChart, go to [email or web address].    Your next appointment:   1 month(s)  The format for your next appointment:   In Person  Provider:   Edd Fabian, FNP       Other Instructions Increase physical activity as tolerated.  Follow the [place] 6 recommendations.

## 2021-05-25 LAB — BASIC METABOLIC PANEL
BUN/Creatinine Ratio: 16 (ref 9–20)
BUN: 14 mg/dL (ref 6–24)
CO2: 20 mmol/L (ref 20–29)
Calcium: 9.5 mg/dL (ref 8.7–10.2)
Chloride: 101 mmol/L (ref 96–106)
Creatinine, Ser: 0.87 mg/dL (ref 0.76–1.27)
Glucose: 191 mg/dL — ABNORMAL HIGH (ref 70–99)
Potassium: 4.5 mmol/L (ref 3.5–5.2)
Sodium: 137 mmol/L (ref 134–144)
eGFR: 103 mL/min/{1.73_m2} (ref >59)

## 2021-06-06 ENCOUNTER — Other Ambulatory Visit: Payer: Self-pay | Admitting: Cardiovascular Disease

## 2021-06-13 NOTE — Progress Notes (Deleted)
Cardiology Office Note   Date:  06/13/2021   ID:  William Hill, DOB [birth year], MRN [id number]  PCP:  [facility]  Cardiologist:  Dr. Allyson Sabal  No chief complaint on file.    History of Present Illness: William [name] a 55 y.o. male who presents for ongoing assessment and management of atrial fibrillation, coronary artery disease, chronic systolic heart failure, ischemic cardiomyopathy (EF of less than 35%), hyperlipidemia, chronic kidney disease, and hypertension.  Last seen in the office on 05/17/2021 by Edd Fabian, FNP.  The patient was on GDMT with [name], spironolactone, with increase of his Entresto to 49/51 mg and he was to follow-up in 1 month for lab work.  On the office visit he was tolerating the increased dose of Entresto, he was continued on carvedilol along with [name].  He will need to have a repeat echocardiogram after maximum dose of Entresto in 1 month.  Atrial fibrillation was well controlled concerning heart rate he was continued on Xarelto denying any bleeding issues.  He was not noted to have any volume overload.  He was to continue low-sodium diet.  Blood pressure at that time was 124/74 with a heart rate of 75.  His weight was 205 pounds. Past Medical History:  Diagnosis Date   CAD (coronary artery disease)    post anterior wall myocardial infarction in 1999 with PCI and stenting at [facility].   Heart attack [facility])    History of stress test 12/2010   EF of 36% with apical scar   Hx of echocardiogram 02/13/2012   EF 35%. There is apical dyskinesis   Hx of echocardiogram 05/09/2012   difinity contrast that showed no apical mural thrombus with an EF of 30%-35%   Hyperlipidemia    Hypertension     Past Surgical History:  Procedure Laterality Date   BACK SURGERY     CORONARY ANGIOPLASTY       Current Outpatient Medications  Medication Sig Dispense Refill   acetaminophen (TYLENOL) 325 MG tablet Take 2 tablets (650 mg  total) by mouth every 6 (six) hours as needed for mild pain (or Fever >/= 101). 12 tablet 0   atorvastatin (LIPITOR) 40 MG tablet TAKE 1 TABLET BY MOUTH EVERYDAY AT BEDTIME 30 tablet 11   carvedilol (COREG) 25 MG tablet Take 1 tablet (25 mg total) by mouth 2 (two) times daily. 180 tablet 3   dapagliflozin propanediol (FARXIGA) 10 MG TABS tablet Take 1 tablet (10 mg total) by mouth daily. 90 tablet 3   fish oil-omega-3 fatty acids 1000 MG capsule Take 1 g by mouth 2 (two) times daily.     glyBURIDE-metformin (GLUCOVANCE) 2.5-500 MG tablet Take half tablet by mouth twice a day for 2 weeks; then increase to full tablet by mouth twice a day. 60 tablet 3   Multiple Vitamin (MULTIVITAMIN WITH MINERALS) TABS tablet Take 1 tablet by mouth daily.     niacin 500 MG tablet Take 500 mg by mouth daily with breakfast.     rivaroxaban (XARELTO) 20 MG TABS tablet Take 1 tablet (20 mg total) by mouth daily with supper. 30 tablet 6   sacubitril-valsartan (ENTRESTO) 49-51 MG Take 1 tablet by mouth 2 (two) times daily. Start this medication after you complete the entresto 24/26 dose 180 tablet 3   No current facility-administered medications for this visit.    Allergies:   Aspirin    Social History:  The patient  reports that he has never  smoked. He has never used smokeless tobacco. He reports that he does not drink alcohol and does not use drugs.   Family History:  The patient's family history includes Cancer in his father; Heart Problems in his maternal grandmother; Heart attack in his paternal grandfather; Stroke in his maternal grandfather.    ROS: All other systems are reviewed and negative. Unless otherwise mentioned in H&P    PHYSICAL EXAM: VS:  There were no vitals taken for this visit. , BMI There is no height or weight on file to calculate BMI. GEN: Well nourished, well developed, in no acute distress HEENT: normal Neck: no JVD, carotid bruits, or masses Cardiac: ***RRR; no murmurs, rubs, or  gallops,no edema  Respiratory:  Clear to auscultation bilaterally, normal work of breathing GI: soft, nontender, nondistended, + BS MS: no deformity or atrophy Skin: warm and dry, no rash Neuro:  Strength and sensation are intact Psych: euthymic mood, full affect   EKG:  EKG {ACTION; IS/IS GBT:[id number]} ordered today. The ekg ordered today demonstrates ***   Recent Labs: 05/24/2021: BUN 14; Creatinine, Ser 0.87; Potassium 4.5; Sodium 137    Lipid Panel    Component Value Date/Time   CHOL 112 01/11/2021 1221   TRIG 124 01/11/2021 1221   HDL 34 (L) 01/11/2021 1221   CHOLHDL 3.3 01/11/2021 1221   CHOLHDL 4.9 03/17/2014 0917   VLDL NOT CALC 03/17/2014 0917   LDLCALC 56 01/11/2021 1221   LDLDIRECT 50 03/17/2014 0917      Wt Readings from Last 3 Encounters:  05/17/21 205 lb (93 kg)  03/29/21 200 lb 3.2 oz (90.8 kg)  03/08/21 203 lb 9.6 oz (92.4 kg)      Other studies Reviewed: Echocardiogram 2021/04/30 1. Left ventricular ejection fraction, by estimation, is 20 to 25%. The  left ventricle has severely decreased function. The left ventricle  demonstrates regional wall motion abnormalities (see scoring  diagram/findings for description). The left  ventricular internal cavity size was moderately dilated. Left ventricular  diastolic function could not be evaluated. There is severe akinesis of the  left ventricular, mid-apical anteroseptal wall, inferior wall, lateral  wall and apical segment.   2. Right ventricular systolic function is normal. The right ventricular  size is normal.   3. The mitral valve is grossly normal. Trivial mitral valve  regurgitation.   4. The aortic valve is normal in structure. Aortic valve regurgitation is  not visualized. No aortic stenosis is present.    NM Stress Test 02/15/2021 Findings are consistent with prior myocardial infarction. The study is high risk due to size of defect.   No ST deviation was noted.   LV perfusion is abnormal.  There is no evidence of inducible ischemia. There is evidence of prior infarction.   There is a large defect with severe reduction in uptake that is fixed. Consistent with infarction. Absent uptake in the apical cap, apex, mid anterior and mid inferior walls.  Severely reduced fixed perfusion defect in the mid ventricular setpum.  Moderately reduced, fixed perfusion defect in the basal anteroseptum and inferior wall.  Mild reduction in uptake that is fixed in the inferoseptum and inferolateral wall.    Grossly unchanged from prior study given differences in image techique and quality.  Findings consistent with prior infarct and no definite ischemia on perfusion images.  ASSESSMENT AND PLAN:  1.  ***   Current medicines are reviewed at length with the patient today.  I have spent *** dedicated to the care of this  patient on the date of this encounter to include pre-visit review of records, assessment, management and diagnostic testing,with shared decision making.  Labs/ tests ordered today include: *** Bettey Mare. Liborio Nixon, ANP, AACC   06/13/2021 12:42 PM    [facility] [address] Office [phone] Fax [phone]  Notice: This dictation was prepared with Dragon dictation along with smaller phrase technology. Any transcriptional errors that result from this process are unintentional and may not be corrected upon review.

## 2021-06-14 ENCOUNTER — Ambulatory Visit: Payer: BC Managed Care – PPO | Admitting: Adult Health

## 2021-08-13 NOTE — Progress Notes (Signed)
Cardiology Clinic Note   Patient Name: William Hill Date of Encounter: 08/16/2021  Primary Care Provider:  [facility] Primary Cardiologist:  Quay Burow, MD  Patient Profile    William Hill 56 year old male presents to the clinic today for follow-up evaluation of his atrial fibrillation, coronary artery disease, and essential hypertension.  Past Medical History    Past Medical History:  Diagnosis Date   CAD (coronary artery disease)    post anterior wall myocardial infarction in 1999 with PCI and stenting at [facility].   Heart attack [facility])    History of stress test 12/2010   EF of 36% with apical scar   Hx of echocardiogram 02/13/2012   EF 35%. There is apical dyskinesis   Hx of echocardiogram 05/09/2012   difinity contrast that showed no apical mural thrombus with an EF of 30%-35%   Hyperlipidemia    Hypertension    Past Surgical History:  Procedure Laterality Date   BACK SURGERY     CORONARY ANGIOPLASTY      Allergies  Allergies  Allergen Reactions   Aspirin     Cannot take due to being on blood thinner-per pt    History of Present Illness    William Hill has a PMH of essential hypertension, ischemic cardiomyopathy, atrial fibrillation, coronary artery disease, chronic systolic CHF, AKI, hyperlipidemia, and cat scratch fever.  He had an MI in 2019 and was noted to have ischemic cardiomyopathy (EF less than 35%).  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  His blood pressure at that time was 118/76.  His repeat echocardiogram showed an EF of 30-35%.  He was started on [place].  He followed up with clinical pharmacist Nehemiah Massed 03/29/2021.  He was doing well at that time.  He was tolerating [place] well.  He did not note any side effects with the medications.  He was continued on his carvedilol 25 mg twice daily.  He denied chest pain, shortness of  breath, lower extremity swelling, dizziness, and lightheadedness.  His prior metabolic panel after starting Entresto remained stable.  He did note some improvement with his energy.  He had also previously been started on spironolactone 12.5 mg daily.  He had planned lab work 2 weeks after initiation however, did not have them drawn.  He was otherwise compliant and did not have any complaints/concerns.  His blood pressure at that time was 130/80.  His metabolic panel 99991111 showed potassium of 4.7 and stable renal function.  He presented the clinic 05/17/21 for follow-up evaluation stated he felt well.  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  We reviewed his echocardiogram which showed a reduced EF.  We also reviewed his blood pressure and his current medications.  He denied side effects with his Entresto 24-26.  I  increased his Entresto to 49/51, ordered a BMP in 1 week, and planned follow-up in 1 month.  I  asked him to increase his physical activity as tolerated and continue his heart healthy low-sodium diet.  He presents to the clinic today for follow-up evaluation and states he feels well.  He continues to work full-time.  He reports that he has been increasing his physical activity.  He enjoys doing yard work and has been clearing leaves and using his leaf blower.  We reviewed the risk of cardiac arrhythmia  and need for repeat echocardiogram.  He is undecided at this time whether he wants to move forward with ICD placement.  I will repeat his echocardiogram, have him maintain his/increase his physical activity, continue his current diet.  He reports compliance with his Xarelto and denies bleeding issues.  We will plan follow-up for after his echocardiogram.  We also discussed having follow-up appointment with the EP.  He and his wife expressed understanding.  Today he denies chest pain, shortness of breath, lower extremity edema, fatigue,  palpitations, melena, hematuria, hemoptysis, diaphoresis, weakness, presyncope, syncope, orthopnea, and PND.   Home Medications    Prior to Admission medications   Medication Sig Start Date End Date Taking? Authorizing Provider  acetaminophen (TYLENOL) 325 MG tablet Take 2 tablets (650 mg total) by mouth every 6 (six) hours as needed for mild pain (or Fever >/= 101). 09/03/18   Emokpae, Courage, MD  atorvastatin (LIPITOR) 40 MG tablet TAKE 1 TABLET BY MOUTH EVERYDAY AT BEDTIME 05/25/20   Lorretta Harp, MD  carvedilol (COREG) 25 MG tablet Take 1 tablet (25 mg total) by mouth 2 (two) times daily. 03/08/21   Lorretta Harp, MD  dapagliflozin propanediol (FARXIGA) 10 MG TABS tablet Take 1 tablet (10 mg total) by mouth daily. 03/08/21   Lorretta Harp, MD  fish oil-omega-3 fatty acids 1000 MG capsule Take 1 g by mouth 2 (two) times daily.    [provider]  glyBURIDE-metformin (GLUCOVANCE) 2.5-500 MG tablet Take half tablet by mouth twice a day for 2 weeks; then increase to full tablet by mouth twice a day. 11/11/18   Barton Dubois, MD  Multiple Vitamin (MULTIVITAMIN WITH MINERALS) TABS tablet Take 1 tablet by mouth daily.    [provider]  niacin 500 MG tablet Take 500 mg by mouth daily with breakfast.    [provider]  rivaroxaban (XARELTO) 20 MG TABS tablet Take 1 tablet (20 mg total) by mouth daily with supper. 01/14/21   Kroeger, Lorelee Cover., PA-C  sacubitril-valsartan (ENTRESTO) 24-26 MG Take 1 tablet by mouth 2 (two) times daily. 01/14/21   Kroeger, Lorelee Cover., PA-C    Family History    Family History  Problem Relation Age of Onset   Cancer Father    Heart Problems Maternal Grandmother    Stroke Maternal Grandfather    Heart attack Paternal Grandfather    He indicated that his mother is alive. He indicated that his father is deceased. He indicated that his maternal grandmother is deceased. He indicated that his maternal grandfather is deceased. He  indicated that his paternal grandmother is deceased. He indicated that his paternal grandfather is deceased.   Social History    Social History   Socioeconomic History   Marital status: Married    Spouse name: Not on file   Number of children: Not on file   Years of education: Not on file   Highest education level: Not on file  Occupational History   Not on file  Tobacco Use   Smoking status: Never   Smokeless tobacco: Never  Vaping Use   Vaping Use: Never used  Substance and Sexual Activity   Alcohol use: No   Drug use: Never   Sexual activity: Not on file  Other Topics Concern   Not on file  Social History Narrative   Not on file   Social Determinants of Health   Financial Resource Strain: Not on file  Food Insecurity: Not on file  Transportation Needs: Not  on file  Physical Activity: Not on file  Stress: Not on file  Social Connections: Not on file  Intimate Partner Violence: Not on file     Review of Systems    General:  No chills, fever, night sweats or weight changes.  Cardiovascular:  No chest pain, dyspnea on exertion, edema, orthopnea, palpitations, paroxysmal nocturnal dyspnea. Dermatological: No rash, lesions/masses Respiratory: No cough, dyspnea Urologic: No hematuria, dysuria Abdominal:   No nausea, vomiting, diarrhea, bright red blood per rectum, melena, or hematemesis Neurologic:  No visual changes, wkns, changes in mental status. All other systems reviewed and are otherwise negative except as noted above.  Physical Exam    VS:  BP 104/70    Pulse 79    Ht 5' 9.5" (1.765 m)    Wt 204 lb (92.5 kg)    SpO2 100%    BMI 29.69 kg/m  , BMI Body mass index is 29.69 kg/m. GEN: Well nourished, well developed, in no acute distress. HEENT: normal. Neck: Supple, no JVD, carotid bruits, or masses. Cardiac: RRR, no murmurs, rubs, or gallops. No clubbing, cyanosis, edema.  Radials/DP/PT 2+ and equal bilaterally.  Respiratory:  Respirations regular and  unlabored, clear to auscultation bilaterally. GI: Soft, nontender, nondistended, BS + x 4. MS: no deformity or atrophy. Skin: warm and dry, no rash. Neuro:  Strength and sensation are intact. Psych: Normal affect.  Accessory Clinical Findings    Recent Labs: 05/24/2021: BUN 14; Creatinine, Ser 0.87; Potassium 4.5; Sodium 137   Recent Lipid Panel    Component Value Date/Time   CHOL 112 01/11/2021 1221   TRIG 124 01/11/2021 1221   HDL 34 (L) 01/11/2021 1221   CHOLHDL 3.3 01/11/2021 1221   CHOLHDL 4.9 03/17/2014 0917   VLDL NOT CALC 03/17/2014 0917   LDLCALC 56 01/11/2021 1221   LDLDIRECT 50 03/17/2014 0917    ECG personally reviewed by me today-atrial flutter with 4 1 AV conduction left axis deviation inferior infarct undetermined age, anterior lateral infarct undetermined age 58 bpm  Nuclear stress test 02/15/2021   Findings are consistent with prior myocardial infarction. The study is high risk due to size of defect.   No ST deviation was noted.   LV perfusion is abnormal. There is no evidence of inducible ischemia. There is evidence of prior infarction.   There is a large defect with severe reduction in uptake that is fixed. Consistent with infarction. Absent uptake in the apical cap, apex, mid anterior and mid inferior walls.  Severely reduced fixed perfusion defect in the mid ventricular setpum.  Moderately reduced, fixed perfusion defect in the basal anteroseptum and inferior wall.  Mild reduction in uptake that is fixed in the inferoseptum and inferolateral wall.    Grossly unchanged from prior study given differences in image techique and quality.  Findings consistent with prior infarct and no definite ischemia on perfusion images.   Echocardiogram 04/05/2021  IMPRESSIONS     1. Left ventricular ejection fraction, by estimation, is 20 to 25%. The  left ventricle has severely decreased function. The left ventricle  demonstrates regional wall motion abnormalities  (see scoring  diagram/findings for description). The left  ventricular internal cavity size was moderately dilated. Left ventricular  diastolic function could not be evaluated. There is severe akinesis of the  left ventricular, mid-apical anteroseptal wall, inferior wall, lateral  wall and apical segment.   2. Right ventricular systolic function is normal. The right ventricular  size is normal.   3. The mitral valve  is grossly normal. Trivial mitral valve  regurgitation.   4. The aortic valve is normal in structure. Aortic valve regurgitation is  not visualized. No aortic stenosis is present.  Assessment & Plan   1.  Ischemic cardiomyopathy-continues to be euvolemic.  No increased DOE or activity intolerance.  Tolerating increased Entresto well.  Follow-up echocardiogram showed slightly worsening EF of 20-25% compared to 30-35% previously.  No significant valvular abnormalities were noted. Continue carvedilol, Farxiga Continue Entresto to 97-103 Heart healthy low-sodium diet-salty 6 given Increase physical activity as tolerated Repeat echocardiogram after he has been on his increased dose of Entresto for more than 1 month.  Atrial fibrillation-EKG today shows atrial flutter with 4 1 AV conduction left axis deviation inferior infarct undetermined age anterior lateral infarct undetermined age 18 bpm.  Cardiac unaware.  Compliant with Xarelto.  Denies any issues with bleeding.  Continue metoprolol, Xarelto Avoid triggers caffeine, chocolate, EtOH, dehydration etc.  Essential hypertension-BP today 104/70.  Well-controlled at home. Continue carvedilol, Entresto Heart healthy low-sodium diet-salty 6 given Increase physical activity as tolerated  Coronary artery disease-no recent episodes of arm neck back or chest discomfort.  Stress test 02/15/2021 showed a LV perfusion abnormality, no evidence of inducible ischemia, high risk due to size of defect.  No changes from previous study. Continue  carvedilol Heart healthy low-sodium diet-salty 6 given Increase physical activity as tolerated  Hyperlipidemia-01/11/2021: Cholesterol, Total 112; HDL 34; LDL Chol Calc (NIH) 56; Triglycerides 124 Continue atorvastatin, omega-3 fatty acids, niacin Heart healthy low-sodium high-fiber diet Increase physical activity as tolerated   Disposition: Follow-up with Dr. Gwenlyn Found or me in 1-2 months.  Jossie Ng. Fronia Depass NP-C    08/16/2021, 11:08 AM [address] Office [phone] Fax [phone]  Notice: This dictation was prepared with Dragon dictation along with smaller phrase technology. Any transcriptional errors that result from this process are unintentional and may not be corrected upon review.  I spent 14 minutes examining this patient, reviewing medications, and using patient centered shared decision making involving her cardiac care.  Prior to her visit I spent greater than 20 minutes reviewing her past medical history,  medications, and prior cardiac tests.

## 2021-08-16 ENCOUNTER — Encounter: Payer: Self-pay | Admitting: General Practice

## 2021-08-16 ENCOUNTER — Other Ambulatory Visit: Payer: Self-pay

## 2021-08-16 ENCOUNTER — Ambulatory Visit (INDEPENDENT_AMBULATORY_CARE_PROVIDER_SITE_OTHER): Payer: BC Managed Care – PPO | Admitting: General Practice

## 2021-08-16 VITALS — BP 104/70 | HR 79 | Ht 69.5 in | Wt 204.0 lb

## 2021-08-16 DIAGNOSIS — I10 Essential (primary) hypertension: Secondary | ICD-10-CM | POA: Diagnosis not present

## 2021-08-16 DIAGNOSIS — I2510 Atherosclerotic heart disease of native coronary artery without angina pectoris: Secondary | ICD-10-CM

## 2021-08-16 DIAGNOSIS — I4891 Unspecified atrial fibrillation: Secondary | ICD-10-CM

## 2021-08-16 DIAGNOSIS — E785 Hyperlipidemia, unspecified: Secondary | ICD-10-CM

## 2021-08-16 DIAGNOSIS — I255 Ischemic cardiomyopathy: Secondary | ICD-10-CM

## 2021-08-16 NOTE — Patient Instructions (Addendum)
Medication Instructions:  The current medical regimen is effective;  continue present plan and medications as directed. Please refer to the Current Medication list given to you today.   *If you need a refill on your cardiac medications before your next appointment, please call your pharmacy*  Lab Work:     none      Testing/Procedures: Echocardiogram - Your physician has requested that you have an echocardiogram. Echocardiography is a painless test that uses sound waves to create images of your heart. It provides your doctor with information about the size and shape of your heart and how well your hearts chambers and valves are working. This procedure takes approximately one hour. There are no restrictions for this procedure. This will be performed at either our [facility] location - [address], [address] -or- [facility] location [facility] 2nd floor.  Special Instructions CONTINUE YOUR DIET  PLEASE MAINTAIN PHYSICAL ACTIVITY AS TOLERATED   Follow-Up: Your next appointment:  1-2 month(s) In Person with Nanetta Batty, MD  or Edd Fabian, FNP  At [facility], you and your health needs are our priority.  As part of our continuing mission to provide you with exceptional heart care, we have created designated Provider Care Teams.  These Care Teams include your primary Cardiologist (physician) and Advanced Practice Providers (APPs -  Physician Assistants and Nurse Practitioners) who all work together to provide you with the care you need, when you need it.

## 2021-09-04 ENCOUNTER — Other Ambulatory Visit: Payer: Self-pay | Admitting: Medical

## 2021-09-04 DIAGNOSIS — I2510 Atherosclerotic heart disease of native coronary artery without angina pectoris: Secondary | ICD-10-CM

## 2021-09-04 DIAGNOSIS — Z79899 Other long term (current) drug therapy: Secondary | ICD-10-CM

## 2021-09-04 DIAGNOSIS — I482 Chronic atrial fibrillation, unspecified: Secondary | ICD-10-CM

## 2021-09-04 DIAGNOSIS — I255 Ischemic cardiomyopathy: Secondary | ICD-10-CM

## 2021-09-06 ENCOUNTER — Other Ambulatory Visit: Payer: Self-pay

## 2021-09-06 ENCOUNTER — Ambulatory Visit (HOSPITAL_COMMUNITY): Payer: BC Managed Care – PPO | Attending: Internal Medicine

## 2021-09-06 DIAGNOSIS — I255 Ischemic cardiomyopathy: Secondary | ICD-10-CM

## 2021-09-06 LAB — ECHOCARDIOGRAM COMPLETE
Area-P 1/2: 4.63 cm2
S' Lateral: 4.6 cm

## 2021-09-06 MED ORDER — PERFLUTREN LIPID MICROSPHERE
1.0000 mL | INTRAVENOUS | Status: AC | PRN
  Administered 2021-09-06: 2 mL via INTRAVENOUS

## 2021-09-06 NOTE — Telephone Encounter (Signed)
Prescription refill request for Xarelto received.  ?Indication: Afib  ?Last office visit: 08/16/21 Molli Hazard) ?Weight: 92.5kg ?Age: 56 ?Scr: 0.87 (05/24/21) ?CrCl: 158ml/min ? ?Appropriate dose and refill sent to requested pharmacy.  ?

## 2021-09-07 ENCOUNTER — Other Ambulatory Visit: Payer: Self-pay

## 2021-09-07 DIAGNOSIS — R931 Abnormal findings on diagnostic imaging of heart and coronary circulation: Secondary | ICD-10-CM

## 2021-09-07 DIAGNOSIS — I351 Nonrheumatic aortic (valve) insufficiency: Secondary | ICD-10-CM

## 2021-09-07 DIAGNOSIS — I340 Nonrheumatic mitral (valve) insufficiency: Secondary | ICD-10-CM

## 2021-09-21 NOTE — Progress Notes (Deleted)
? ?Cardiology Clinic Note  ? ?Patient Name: William Hill ?Date of Encounter: 09/21/2021 ? ?Primary Care Provider:  [facility] ?Primary Cardiologist:  Quay Burow, MD ? ?Patient Profile  ?  ?William Hill 56 year old male presents to the clinic today for follow-up evaluation of his atrial fibrillation, coronary artery disease, and essential hypertension. ? ?Past Medical History  ?  ?Past Medical History:  ?Diagnosis Date  ? CAD (coronary artery disease)   ? post anterior wall myocardial infarction in 1999 with PCI and stenting at [facility].  ? Heart attack ([place])   ? History of stress test 12/2010  ? EF of 36% with apical scar  ? Hx of echocardiogram 02/13/2012  ? EF 35%. There is apical dyskinesis  ? Hx of echocardiogram 05/09/2012  ? difinity contrast that showed no apical mural thrombus with an EF of 30%-35%  ? Hyperlipidemia   ? Hypertension   ? ?Past Surgical History:  ?Procedure Laterality Date  ? BACK SURGERY    ? CORONARY ANGIOPLASTY    ? ? ?Allergies ? ?Allergies  ?Allergen Reactions  ? Aspirin   ?  Cannot take due to being on blood thinner-per pt  ? ? ?History of Present Illness  ?  ?William Hill has a PMH of essential hypertension, ischemic cardiomyopathy, atrial fibrillation, coronary artery disease, chronic systolic CHF, AKI, hyperlipidemia, and cat scratch fever.  He had an MI in 2019 and was noted to have ischemic cardiomyopathy (EF less than 35%).  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  His blood pressure at that time was 118/76.  His repeat echocardiogram showed an EF of 30-35%.  He was started on [place]. ? ?He followed up with clinical pharmacist Nehemiah Massed 03/29/2021.  He was doing well at that time.  He was tolerating [place] well.  He did not note any side effects with the medications.  He was continued on his carvedilol 25 mg twice daily.  He denied chest pain, shortness of  breath, lower extremity swelling, dizziness, and lightheadedness.  His prior metabolic panel after starting Entresto remained stable.  He did note some improvement with his energy. ? ?He had also previously been started on spironolactone 12.5 mg daily.  He had planned lab work 2 weeks after initiation however, did not have them drawn.  He was otherwise compliant and did not have any complaints/concerns.  His blood pressure at that time was 130/80.  His metabolic panel 99991111 showed potassium of 4.7 and stable renal function. ? ?He presented the clinic 05/17/21 for follow-up evaluation stated he felt well.  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  We reviewed his echocardiogram which showed a reduced EF.  We also reviewed his blood pressure and his current medications.  He denied side effects with his Entresto 24-26.  I  increased his Entresto to 49/51, ordered a BMP in 1 week, and planned follow-up in 1 month.  I  asked him to increase his physical activity as tolerated and continue his heart healthy low-sodium diet. ? ?He presented  to the clinic 08/16/21 for follow-up evaluation and stated he felt well.  He continues to work full-time.  He reported that he had been increasing his physical activity.  He enjoyed doing yard work and had been clearing leaves and using his leaf blower.  We reviewed the risk of cardiac  arrhythmia and need for repeat echocardiogram.  He was undecided at the time whether he wanted to move forward with ICD placement.  I  repeated his echocardiogram, asked him to maintain his/increase his physical activity, continue his current diet.  He reported compliance with his Xarelto and denies bleeding issues.  We  planned follow-up for after his echocardiogram.  We also discussed having follow-up appointment with the EP.  He and his wife expressed understanding.  His repeat echocardiogram showed an improvement in his EF up to 30-35% from  20-25%.  Referral to EP for discussion of ICD implantation was recommended.  Follow-up appointment with Dr. Lovena Le 10/04/21. ? ?He presents to the clinic today for follow-up evaluation states*** ? ?Today he denies chest pain, shortness of breath, lower extremity edema, fatigue, palpitations, melena, hematuria, hemoptysis, diaphoresis, weakness, presyncope, syncope, orthopnea, and PND. ? ? ?Home Medications  ?  ?Prior to Admission medications   ?Medication Sig Start Date End Date Taking? Authorizing Provider  ?acetaminophen (TYLENOL) 325 MG tablet Take 2 tablets (650 mg total) by mouth every 6 (six) hours as needed for mild pain (or Fever >/= 101). 09/03/18   Emokpae, Courage, MD  ?atorvastatin (LIPITOR) 40 MG tablet TAKE 1 TABLET BY MOUTH EVERYDAY AT BEDTIME 05/25/20   Lorretta Harp, MD  ?carvedilol (COREG) 25 MG tablet Take 1 tablet (25 mg total) by mouth 2 (two) times daily. 03/08/21   Lorretta Harp, MD  ?dapagliflozin propanediol (FARXIGA) 10 MG TABS tablet Take 1 tablet (10 mg total) by mouth daily. 03/08/21   Lorretta Harp, MD  ?fish oil-omega-3 fatty acids 1000 MG capsule Take 1 g by mouth 2 (two) times daily.    [provider]  ?glyBURIDE-metformin (GLUCOVANCE) 2.5-500 MG tablet Take half tablet by mouth twice a day for 2 weeks; then increase to full tablet by mouth twice a day. 11/11/18   Barton Dubois, MD  ?Multiple Vitamin (MULTIVITAMIN WITH MINERALS) TABS tablet Take 1 tablet by mouth daily.    [provider]  ?niacin 500 MG tablet Take 500 mg by mouth daily with breakfast.    [provider]  ?rivaroxaban (XARELTO) 20 MG TABS tablet Take 1 tablet (20 mg total) by mouth daily with supper. 01/14/21   Kroeger, Lorelee Cover., PA-C  ?sacubitril-valsartan (ENTRESTO) 24-26 MG Take 1 tablet by mouth 2 (two) times daily. 01/14/21   Kroeger, Lorelee Cover., PA-C  ? ? ?Family History  ?  ?Family History  ?Problem Relation Age of Onset  ? Cancer Father   ? Heart Problems Maternal Grandmother    ? Stroke Maternal Grandfather   ? Heart attack Paternal Grandfather   ? ?He indicated that his mother is alive. He indicated that his father is deceased. He indicated that his maternal grandmother is deceased. He indicated that his maternal grandfather is deceased. He indicated that his paternal grandmother is deceased. He indicated that his paternal grandfather is deceased. ? ? ?Social History  ?  ?Social History  ? ?Socioeconomic History  ? Marital status: Married  ?  Spouse name: Not on file  ? Number of children: Not on file  ? Years of education: Not on file  ? Highest education level: Not on file  ?Occupational History  ? Not on file  ?Tobacco Use  ? Smoking status: Never  ? Smokeless tobacco: Never  ?Vaping Use  ? Vaping Use: Never used  ?Substance and Sexual Activity  ? Alcohol use: No  ? Drug use: Never  ? Sexual  activity: Not on file  ?Other Topics Concern  ? Not on file  ?Social History Narrative  ? Not on file  ? ?Social Determinants of Health  ? ?Financial Resource Strain: Not on file  ?Food Insecurity: Not on file  ?Transportation Needs: Not on file  ?Physical Activity: Not on file  ?Stress: Not on file  ?Social Connections: Not on file  ?Intimate Partner Violence: Not on file  ?  ? ?Review of Systems  ?  ?General:  No chills, fever, night sweats or weight changes.  ?Cardiovascular:  No chest pain, dyspnea on exertion, edema, orthopnea, palpitations, paroxysmal nocturnal dyspnea. ?Dermatological: No rash, lesions/masses ?Respiratory: No cough, dyspnea ?Urologic: No hematuria, dysuria ?Abdominal:   No nausea, vomiting, diarrhea, bright red blood per rectum, melena, or hematemesis ?Neurologic:  No visual changes, wkns, changes in mental status. ?All other systems reviewed and are otherwise negative except as noted above. ? ?Physical Exam  ?  ?VS:  There were no vitals taken for this visit. , BMI There is no height or weight on file to calculate BMI. ?GEN: Well nourished, well developed, in no acute  distress. ?HEENT: normal. ?Neck: Supple, no JVD, carotid bruits, or masses. ?Cardiac: RRR, no murmurs, rubs, or gallops. No clubbing, cyanosis, edema.  Radials/DP/PT 2+ and equal bilaterally.  ?Respiratory:

## 2021-09-27 ENCOUNTER — Ambulatory Visit: Payer: BC Managed Care – PPO | Admitting: General Practice

## 2021-10-04 ENCOUNTER — Encounter: Payer: Self-pay | Admitting: General Practice

## 2021-10-04 ENCOUNTER — Ambulatory Visit: Payer: BC Managed Care – PPO | Admitting: Internal Medicine

## 2021-12-29 ENCOUNTER — Encounter: Payer: Self-pay | Admitting: Internal Medicine

## 2022-02-02 ENCOUNTER — Emergency Department (HOSPITAL_COMMUNITY)
Admission: EM | Admit: 2022-02-02 | Discharge: 2022-02-02 | Disposition: A | Discharge status: Home / Self Care | Payer: BC Managed Care – PPO | Attending: Emergency Medicine | Admitting: Emergency Medicine

## 2022-02-02 ENCOUNTER — Encounter (HOSPITAL_COMMUNITY): Payer: Self-pay

## 2022-02-02 ENCOUNTER — Emergency Department (HOSPITAL_COMMUNITY): Payer: BC Managed Care – PPO

## 2022-02-02 ENCOUNTER — Other Ambulatory Visit: Payer: Self-pay

## 2022-02-02 DIAGNOSIS — L03211 Cellulitis of face: Secondary | ICD-10-CM | POA: Diagnosis present

## 2022-02-02 DIAGNOSIS — I2510 Atherosclerotic heart disease of native coronary artery without angina pectoris: Secondary | ICD-10-CM | POA: Insufficient documentation

## 2022-02-02 DIAGNOSIS — Z79899 Other long term (current) drug therapy: Secondary | ICD-10-CM | POA: Diagnosis not present

## 2022-02-02 DIAGNOSIS — I5022 Chronic systolic (congestive) heart failure: Secondary | ICD-10-CM | POA: Insufficient documentation

## 2022-02-02 DIAGNOSIS — N179 Acute kidney failure, unspecified: Secondary | ICD-10-CM | POA: Diagnosis not present

## 2022-02-02 DIAGNOSIS — I110 Hypertensive heart disease with heart failure: Secondary | ICD-10-CM | POA: Diagnosis not present

## 2022-02-02 DIAGNOSIS — K047 Periapical abscess without sinus: Secondary | ICD-10-CM | POA: Diagnosis not present

## 2022-02-02 LAB — BASIC METABOLIC PANEL
Anion gap: 7 (ref 5–15)
BUN: 14 mg/dL (ref 6–20)
CO2: 26 mmol/L (ref 22–32)
Calcium: 9.4 mg/dL (ref 8.9–10.3)
Chloride: 107 mmol/L (ref 98–111)
Creatinine, Ser: 0.78 mg/dL (ref 0.61–1.24)
GFR, Estimated: >60 mL/min (ref >60)
Glucose, Bld: 88 mg/dL (ref 70–99)
Potassium: 4.4 mmol/L (ref 3.5–5.1)
Sodium: 140 mmol/L (ref 135–145)

## 2022-02-02 LAB — CBC WITH DIFFERENTIAL/PLATELET
Abs Immature Granulocytes: 0.02 10*3/uL (ref 0.00–0.07)
Basophils Absolute: 0.1 10*3/uL (ref 0.0–0.1)
Basophils Relative: 1 %
Eosinophils Absolute: 0.2 10*3/uL (ref 0.0–0.5)
Eosinophils Relative: 2 %
HCT: 48.7 % (ref 39.0–52.0)
Hemoglobin: 17 g/dL (ref 13.0–17.0)
Immature Granulocytes: 0 %
Lymphocytes Relative: 22 %
Lymphs Abs: 2.2 10*3/uL (ref 0.7–4.0)
MCH: 34.3 pg — ABNORMAL HIGH (ref 26.0–34.0)
MCHC: 34.9 g/dL (ref 30.0–36.0)
MCV: 98.4 fL (ref 80.0–100.0)
Monocytes Absolute: 1.3 10*3/uL — ABNORMAL HIGH (ref 0.1–1.0)
Monocytes Relative: 13 %
Neutro Abs: 6.2 10*3/uL (ref 1.7–7.7)
Neutrophils Relative %: 62 %
Platelets: 152 10*3/uL (ref 150–400)
RBC: 4.95 MIL/uL (ref 4.22–5.81)
RDW: 12.8 % (ref 11.5–15.5)
WBC: 10 10*3/uL (ref 4.0–10.5)
nRBC: 0 % (ref 0.0–0.2)

## 2022-02-02 MED ORDER — SODIUM CHLORIDE 0.9 % IV SOLN
3.0000 g | Freq: Once | INTRAVENOUS | Status: AC
  Administered 2022-02-02: 3 g via INTRAVENOUS
  Filled 2022-02-02: qty 8

## 2022-02-02 MED ORDER — IOHEXOL 300 MG/ML  SOLN
75.0000 mL | Freq: Once | INTRAMUSCULAR | Status: AC | PRN
  Administered 2022-02-02: 75 mL via INTRAVENOUS

## 2022-02-02 MED ORDER — KETOROLAC TROMETHAMINE 15 MG/ML IJ SOLN
15.0000 mg | Freq: Once | INTRAMUSCULAR | Status: AC
  Administered 2022-02-02: 15 mg via INTRAVENOUS
  Filled 2022-02-02: qty 1

## 2022-02-02 MED ORDER — OXYCODONE-ACETAMINOPHEN 5-325 MG PO TABS
1.0000 | ORAL_TABLET | Freq: Once | ORAL | Status: AC
  Administered 2022-02-02: 1 via ORAL
  Filled 2022-02-02: qty 1

## 2022-02-02 NOTE — Discharge Instructions (Signed)
Your imaging today showed evidence of dental abscess associated with facial cellulitis or superficial infection of your skin of the face.  Continue taking the clindamycin as prescribed and Tylenol and ibuprofen as needed for pain or fever.  I have spoken to our dentist on-call, his phone number is included in your discharge paperwork, call Dr. Mia Creek today after being discharged to make your appointment either today or tomorrow to have the tooth pulled and abscess drained.  Come back if you have any issues seeing the dentist, severe worsening facial pain, high fevers, inability take your antibiotics, or any other symptoms concerning to you.

## 2022-02-02 NOTE — ED Triage Notes (Signed)
Pt states abscess to right side of face x 2 days. Pt was given rx for clindamycin yesterday and has not improved.

## 2022-02-02 NOTE — ED Provider Notes (Signed)
[facility] EMERGENCY DEPARTMENT Provider Note   CSN: [id number] Arrival date & time: 02/02/22  7262     History  Chief Complaint  Patient presents with   Abscess    William [name] a 56 y.o. male. With pmh  essential hypertension, ischemic cardiomyopathy, atrial fibrillation, coronary artery disease, chronic systolic CHF, AKI, hyperlipidemia presenting with worsening right facial swelling and pain despite taking clindamycin.  Patient started developing facial pain and swelling this past weekend around Saturday.  It has worsened since Saturday.  He went to see his PCP yesterday who started him on clindamycin 300 mg every 6 hours.  He is already taken 4 doses.  He does not seen a dentist.  However, he does note having a loose tooth in the back of his mouth that was not traumatic or recent.  He has had no fevers, nausea, vomiting, pain with eye movement, visual changes, shortness of breath, cough, rhinorrhea or congestion.   Abscess      Home Medications Prior to Admission medications   Medication Sig Start Date End Date Taking? Authorizing Provider  acetaminophen (TYLENOL) 325 MG tablet Take 2 tablets (650 mg total) by mouth every 6 (six) hours as needed for mild pain (or Fever >/= 101). 09/03/18   Emokpae, Courage, MD  atorvastatin (LIPITOR) 40 MG tablet TAKE 1 TABLET BY MOUTH EVERYDAY AT BEDTIME 06/07/21   Runell Gess, MD  carvedilol (COREG) 25 MG tablet Take 1 tablet (25 mg total) by mouth 2 (two) times daily. 03/08/21   Runell Gess, MD  dapagliflozin propanediol (FARXIGA) 10 MG TABS tablet Take 1 tablet (10 mg total) by mouth daily. 03/08/21   Runell Gess, MD  fish oil-omega-3 fatty acids 1000 MG capsule Take 1 g by mouth 2 (two) times daily.    [provider]  glyBURIDE-metformin (GLUCOVANCE) 2.5-500 MG tablet Take half tablet by mouth twice a day for 2 weeks; then increase to full tablet by mouth twice a day. 11/11/18   Vassie Loll, MD  Multiple Vitamin  (MULTIVITAMIN WITH MINERALS) TABS tablet Take 1 tablet by mouth daily.    [provider]  niacin 500 MG tablet Take 500 mg by mouth daily with breakfast.    [provider]  sacubitril-valsartan (ENTRESTO) 49-51 MG Take 1 tablet by mouth 2 (two) times daily. Start this medication after you complete the entresto 24/26 dose 05/17/21   Cleaver, Thomasene Ripple, NP  XARELTO 20 MG TABS tablet TAKE 1 TABLET BY MOUTH DAILY WITH SUPPER. 09/06/21   Runell Gess, MD      Allergies    Aspirin    Review of Systems   Review of Systems  Physical Exam Updated Vital Signs BP 114/75   Pulse (!) 52   Temp 98.2 F (36.8 C) (Oral)   Resp 16   Ht 5\' 9"  (1.753 m)   Wt 95.7 kg   SpO2 100%   BMI 31.16 kg/m  Physical Exam Constitutional: Alert and oriented. Well appearing and in no distress. Eyes: Conjunctivae are normal.  PERRL.  No pain with EOMI. Mild right inferior periorbital swelling ENT      Head: Normocephalic and atraumatic.  Right facial swelling and erythema with induration, no fluctuance.      Nose: No congestion.      Mouth/Throat: Mucous membranes are moist. Tooth #3, right posterior maxillary molar with dental caries and luxation, easily mobile and tender on exam with surrounding erythema and edema as seen in media.  Neck: No stridor. Cardiovascular: S1, S2,  Normal and symmetric distal pulses are present in all extremities.Warm and well perfused. Respiratory: Normal respiratory effort. Breath sounds are normal. Musculoskeletal: Normal range of motion in all extremities. Neurologic: Normal speech and language. No gross focal neurologic deficits are appreciated. Skin: Skin is warm, dry and intact. No rash noted. Psychiatric: Mood and affect are normal. Speech and behavior are normal.     ED Results / Procedures / Treatments   Labs (all labs ordered are listed, but only abnormal results are displayed) Labs Reviewed  CBC WITH DIFFERENTIAL/PLATELET - Abnormal;  Notable for the following components:      Result Value   MCH 34.3 (*)    Monocytes Absolute 1.3 (*)    All other components within normal limits  BASIC METABOLIC PANEL    EKG None  Radiology CT Maxillofacial W Contrast  Result Date: 02/02/2022 CLINICAL DATA:  Right facial swelling with infected tooth. Rule out abscess. EXAM: CT MAXILLOFACIAL WITH CONTRAST TECHNIQUE: Multidetector CT imaging of the maxillofacial structures was performed with intravenous contrast. Multiplanar CT image reconstructions were also generated. RADIATION DOSE REDUCTION: This exam was performed according to the departmental dose-optimization program which includes automated exposure control, adjustment of the mA and/or kV according to patient size and/or use of iterative reconstruction technique. CONTRAST:  13mL OMNIPAQUE IOHEXOL 300 MG/ML  SOLN COMPARISON:  None Available. FINDINGS: Osseous: Multiple caries upper and lower teeth. Extensive periapical lucency around right upper molar. Periapical lucency around left upper canine and left upper molars. Periapical lucency around left lower molar. Orbits: No orbital mass or edema. There is soft tissue edema below the right orbit involving the maxilla. Sinuses: Mucosal edema in the maxillary sinus, right greater than left. No air-fluid levels. Soft tissues: Large fluid collection lateral to the right maxilla compatible with soft tissue abscess. This measures 27 x 13 mm and shows peripheral enhancement and central fluid density. This is adjacent to the right upper molar periapical lucency. Overlying cellulitis is present in the region the right maxilla. Limited intracranial: No acute abnormality IMPRESSION: Poor dentition with numerous caries.  Numerous periapical lucencies. Large subperiosteal abscess lateral to the right maxilla associated with the periapical lucency around the right upper molar. Abscess measures 27 x 13 mm in diameter with overlying cellulitis. Electronically  Signed   By: Marlan Palau M.D.   On: 02/02/2022 10:16    Procedures Procedures    Medications Ordered in ED Medications  ketorolac (TORADOL) 15 MG/ML injection 15 mg (15 mg Intravenous Given 02/02/22 0835)  Ampicillin-Sulbactam (UNASYN) 3 g in sodium chloride 0.9 % 100 mL IVPB (0 g Intravenous Stopped 02/02/22 0909)  oxyCODONE-acetaminophen (PERCOCET/ROXICET) 5-325 MG per tablet 1 tablet (1 tablet Oral Given 02/02/22 0830)  iohexol (OMNIPAQUE) 300 MG/ML solution 75 mL (75 mLs Intravenous Contrast Given 02/02/22 0946)    ED Course/ Medical Decision Making/ A&P Clinical Course as of 02/02/22 1115  Wed Feb 02, 2022  0904 Patient's labs generally unremarkable and reassuring.  Normal white blood cell count 10.0, no left shift.  No acute electrolyte abnormalities.  Creatinine 0.78. [VB]  1040 On personal interpretation, agree with final results of dental abscess "Poor dentition with numerous caries.  Numerous periapical lucencies.  Large subperiosteal abscess lateral to the right maxilla associated with the periapical lucency around the right upper molar. Abscess measures 27 x 13 mm in diameter with overlying cellulitis.  " Spoke with Mathis Dad of dentistry who will see patient either today or tomorrow in  clinic.  I have included his phone number for patient to call after discharge to make an appointment.  Patient will continue taking clindamycin as prescribed by PCP.  Strict return precautions discussed. [VB]    Clinical Course User Index [VB] Mardene Sayer, MD                           Medical Decision Making LONZO SAULTER is a 56 y.o. male. With pmh  essential hypertension, ischemic cardiomyopathy, atrial fibrillation, coronary artery disease, chronic systolic CHF, AKI, hyperlipidemia presenting with worsening right facial swelling and pain despite taking clindamycin.  Patient's presentation is most consistent with right facial cellulitis likely secondary to underlying dental  infection.  Will obtain CT face with contrast to further evaluate for deep space abscess.  He has no clinical signs or symptoms of sepsis.  He has no pain with EOMI or visual changes concerning for orbital cellulitis.  No red the neck, raised tongue or concerning findings suggestive of Ludwigs.  Will plan on basic labs, CT face with IV contrast and treat patient with IV Unasyn 3 g, IV Toradol and 1 dose of oral Percocet.  If no deep space abscess, likely will require referral to dental for removal of tooth.  Amount and/or Complexity of Data Reviewed Labs: ordered. Decision-making details documented in ED Course. Radiology: ordered.    Details: right maxillary sinus  Risk Prescription drug management.   Final Clinical Impression(s) / ED Diagnoses Final diagnoses:  Facial cellulitis  Dental abscess    Rx / DC Orders ED Discharge Orders     None         Mardene Sayer, MD 02/02/22 1116

## 2022-02-03 ENCOUNTER — Telehealth: Payer: Self-pay | Admitting: Cardiovascular Disease

## 2022-02-03 NOTE — Telephone Encounter (Addendum)
   Patient Name: William Hill  DOB: [birth year] MRN: [id number]  Primary Cardiologist: Nanetta Batty, MD  Chart reviewed as part of pre-operative protocol coverage. Original clearance inadvertantly routed with default "is/is not" text with regard to SBE ppx. This is meant to say SBE prophylaxis is not required for the patient from a cardiac standpoint based on cardiac records. Remainder of the clearance form is correct.   I have corrected the entry and called DDS office to make them aware. Will also refax so they have corrected version.  Laurann Montana, PA-C 02/03/2022, 4:22 PM

## 2022-02-03 NOTE — Telephone Encounter (Addendum)
   Patient Name: William Hill  DOB: 1965/09/29 MRN: [id number]  Primary Cardiologist: Nanetta Batty, MD  Chart reviewed as part of pre-operative protocol coverage.   Simple dental extractions (i.e. 1-2 teeth) are considered low risk procedures per guidelines and generally do not require any specific cardiac clearance. It is also generally accepted that for simple extractions and dental cleanings, there is no need to interrupt blood thinner therapy. We do not typically stop Xarelto for 1-2 teeth extractions.  SBE prophylaxis is not required for the patient from a cardiac standpoint based on cardiac records.  I will route this recommendation to the requesting party via Epic fax function and remove from pre-op pool.  Please call with questions.  Laurann Montana, PA-C 02/03/2022, 10:58 AM

## 2022-02-03 NOTE — Telephone Encounter (Signed)
Routed via fax to DDS but will route to callback to make them aware since it says procedure date of today.

## 2022-02-03 NOTE — Telephone Encounter (Signed)
S/w Summer at Dr. Mia Creek , DDS office and they have received the clearance notes from [place], [facility]

## 2022-02-03 NOTE — Telephone Encounter (Signed)
   Pre-operative Risk Assessment    Patient Name: William Hill  DOB: [birth year] MRN: [id number]     Request for Surgical Clearance    Procedure:  Dental Extraction - Amount of Teeth to be Pulled:  1  Date of Surgery:  Clearance 02/03/22                                 Surgeon:  Dr. Loura Pardon Surgeon's Group or Practice Name:  [facility]  Phone number:  [phone] Fax number:  [phone]   Type of Clearance Requested:   - Medical  - Pharmacy:  Hold Rivaroxaban (Xarelto)     Type of Anesthesia:  None    Additional requests/questions:  Dental office would just to make sure this would be okay and no major red flags.   Signed, April Henson   02/03/2022, 9:12 AM

## 2022-02-07 ENCOUNTER — Telehealth: Payer: Self-pay | Admitting: General Practice

## 2022-02-07 NOTE — Telephone Encounter (Signed)
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

## 2022-02-08 NOTE — Telephone Encounter (Signed)
Attempted to contact patient, unable to leave a message.  Will try and send mychart message for patient to give information on this information.  Thanks!

## 2022-03-14 ENCOUNTER — Other Ambulatory Visit: Payer: Self-pay | Admitting: Cardiovascular Disease

## 2022-04-06 ENCOUNTER — Other Ambulatory Visit: Payer: Self-pay | Admitting: Internal Medicine

## 2022-04-11 ENCOUNTER — Other Ambulatory Visit: Payer: Self-pay | Admitting: Internal Medicine

## 2022-04-12 ENCOUNTER — Other Ambulatory Visit: Payer: Self-pay | Admitting: Cardiovascular Disease

## 2022-04-12 DIAGNOSIS — Z79899 Other long term (current) drug therapy: Secondary | ICD-10-CM

## 2022-04-12 DIAGNOSIS — I255 Ischemic cardiomyopathy: Secondary | ICD-10-CM

## 2022-04-12 DIAGNOSIS — I482 Chronic atrial fibrillation, unspecified: Secondary | ICD-10-CM

## 2022-04-12 DIAGNOSIS — I2510 Atherosclerotic heart disease of native coronary artery without angina pectoris: Secondary | ICD-10-CM

## 2022-04-12 MED ORDER — DAPAGLIFLOZIN PROPANEDIOL 10 MG PO TABS: 10.0000 mg | ORAL_TABLET | Freq: Every day | ORAL | 4 refills | Status: DC

## 2022-04-12 NOTE — Telephone Encounter (Signed)
Prescription refill request for Xarelto received.  Indication:Afib Last office visit:2/23 Weight:95.7 kg Age:56 Scr:0.7 CrCl:161.4 ml/min  Prescription refilled

## 2022-04-25 ENCOUNTER — Encounter: Payer: Self-pay | Admitting: Internal Medicine

## 2022-04-25 ENCOUNTER — Ambulatory Visit: Payer: BC Managed Care – PPO | Attending: Internal Medicine | Admitting: Internal Medicine

## 2022-04-25 VITALS — BP 102/74 | HR 74 | Ht 69.5 in | Wt 216.0 lb

## 2022-04-25 DIAGNOSIS — I4891 Unspecified atrial fibrillation: Secondary | ICD-10-CM | POA: Diagnosis not present

## 2022-04-25 NOTE — Patient Instructions (Signed)
Medication Instructions:  Your physician recommends that you continue on your current medications as directed. Please refer to the Current Medication list given to you today.  *If you need a refill on your cardiac medications before your next appointment, please call your pharmacy*   Lab Work: None If you have labs (blood work) drawn today and your tests are completely normal, you will receive your results only by: [place] (if you have MyChart) OR A paper copy in the mail If you have any lab test that is abnormal or we need to change your treatment, we will call you to review the results.   Follow-Up: At [facility], you and your health needs are our priority.  As part of our continuing mission to provide you with exceptional heart care, we have created designated Provider Care Teams.  These Care Teams include your primary Cardiologist (physician) and Advanced Practice Providers (APPs -  Physician Assistants and Nurse Practitioners) who all work together to provide you with the care you need, when you need it.   Your next appointment:   As needed  Important Information About Sugar

## 2022-04-25 NOTE — Progress Notes (Signed)
HPI Mr. Kurylo is referred for evaluation of and consideration for ICD insertion. He is a pleasant 56 yo man with a h/o HTN and dyslipidemia, who presents for consideration for ICD insertion. The patient has an ICM, s/p MI, persistent but mostly asymptomatic atrial fib, on systemic anti-coagulation. He has been on maximal medical therapy. His EF initially improved but has worsened. He has class 2 symptoms. No syncope. No edema.  Allergies  Allergen Reactions   Aspirin     Cannot take due to being on blood thinner-per pt     Current Outpatient Medications  Medication Sig Dispense Refill   acetaminophen (TYLENOL) 325 MG tablet Take 2 tablets (650 mg total) by mouth every 6 (six) hours as needed for mild pain (or Fever >/= 101). 12 tablet 0   atorvastatin (LIPITOR) 40 MG tablet TAKE 1 TABLET BY MOUTH EVERYDAY AT BEDTIME 30 tablet 11   carvedilol (COREG) 25 MG tablet TAKE 1 TABLET BY MOUTH TWICE A DAY 180 tablet 1   dapagliflozin propanediol (FARXIGA) 10 MG TABS tablet Take 1 tablet (10 mg total) by mouth daily. 30 tablet 4   fish oil-omega-3 fatty acids 1000 MG capsule Take 1 g by mouth 2 (two) times daily.     glyBURIDE-metformin (GLUCOVANCE) 2.5-500 MG tablet Take half tablet by mouth twice a day for 2 weeks; then increase to full tablet by mouth twice a day. 60 tablet 3   Multiple Vitamin (MULTIVITAMIN WITH MINERALS) TABS tablet Take 1 tablet by mouth daily.     niacin 500 MG tablet Take 500 mg by mouth daily with breakfast.     sacubitril-valsartan (ENTRESTO) 49-51 MG Take 1 tablet by mouth 2 (two) times daily. Start this medication after you complete the entresto 24/26 dose 180 tablet 3   XARELTO 20 MG TABS tablet TAKE 1 TABLET BY MOUTH DAILY WITH SUPPER 30 tablet 6   No current facility-administered medications for this visit.     Past Medical History:  Diagnosis Date   CAD (coronary artery disease)    post anterior wall myocardial infarction in 1999 with PCI and stenting at  [facility].   Heart attack [facility])    History of stress test 12/2010   EF of 36% with apical scar   Hx of echocardiogram 02/13/2012   EF 35%. There is apical dyskinesis   Hx of echocardiogram 05/09/2012   difinity contrast that showed no apical mural thrombus with an EF of 30%-35%   Hyperlipidemia    Hypertension     ROS:   All systems reviewed and negative except as noted in the HPI.   Past Surgical History:  Procedure Laterality Date   BACK SURGERY     CORONARY ANGIOPLASTY       Family History  Problem Relation Age of Onset   Cancer Father    Heart Problems Maternal Grandmother    Stroke Maternal Grandfather    Heart attack Paternal Grandfather      Social History   Socioeconomic History   Marital status: Married    Spouse name: Not on file   Number of children: Not on file   Years of education: Not on file   Highest education level: Not on file  Occupational History   Not on file  Tobacco Use   Smoking status: Never   Smokeless tobacco: Never  Vaping Use   Vaping Use: Never used  Substance and Sexual Activity   Alcohol use: No   Drug use:  Never   Sexual activity: Not on file  Other Topics Concern   Not on file  Social History Narrative   Not on file   Social Determinants of Health   Financial Resource Strain: Not on file  Food Insecurity: Not on file  Transportation Needs: Not on file  Physical Activity: Not on file  Stress: Not on file  Social Connections: Not on file  Intimate Partner Violence: Not on file     BP 102/74   Pulse 74   Ht 5' 9.5" (1.765 m)   Wt 216 lb (98 kg)   BMI 31.44 kg/m   Physical Exam:  Well appearing NAD HEENT: Unremarkable Neck:  No JVD, no thyromegally Lymphatics:  No adenopathy Back:  No CVA tenderness Lungs:  Clear with no wheezes HEART:  IRegular rate rhythm, no murmurs, no rubs, no clicks Abd:  soft, positive bowel sounds, no organomegally, no rebound, no guarding Ext:  2 plus pulses, no edema,  no cyanosis, no clubbing Skin:  No rashes no nodules Neuro:  CN II through XII intact, motor grossly intact  EKG - atrial fib with a controlled VR  Assess/Plan:  Chronic systolic heart failure - his EF by echo was 25%. He is on maximal medical therapy. I have offered him ICD insertion for primary prevention. He will call us if he wishes to proceed. 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 Atrial fib - his VR is well controlled and he is asymptomatic. I would recommend rate control. Coags - he will continue xarelto.  Dyslipidemia - continue statin therapy.   Carleene Overlie Abbigael Detlefsen,MD

## 2022-06-13 ENCOUNTER — Other Ambulatory Visit: Payer: Self-pay | Admitting: General Practice

## 2022-06-13 ENCOUNTER — Other Ambulatory Visit: Payer: Self-pay | Admitting: Cardiovascular Disease

## 2022-06-13 DIAGNOSIS — E785 Hyperlipidemia, unspecified: Secondary | ICD-10-CM

## 2022-06-13 DIAGNOSIS — I255 Ischemic cardiomyopathy: Secondary | ICD-10-CM

## 2022-06-13 DIAGNOSIS — I10 Essential (primary) hypertension: Secondary | ICD-10-CM

## 2022-06-13 DIAGNOSIS — I2510 Atherosclerotic heart disease of native coronary artery without angina pectoris: Secondary | ICD-10-CM

## 2022-06-13 DIAGNOSIS — I4891 Unspecified atrial fibrillation: Secondary | ICD-10-CM

## 2022-09-10 ENCOUNTER — Other Ambulatory Visit: Payer: Self-pay | Admitting: Cardiovascular Disease

## 2022-10-10 ENCOUNTER — Other Ambulatory Visit: Payer: Self-pay

## 2022-10-10 MED ORDER — CARVEDILOL 25 MG PO TABS: 25.0000 mg | ORAL_TABLET | Freq: Two times a day (BID) | ORAL | 1 refills | Status: DC

## 2022-11-12 ENCOUNTER — Other Ambulatory Visit: Payer: Self-pay | Admitting: Cardiovascular Disease

## 2022-11-12 DIAGNOSIS — I482 Chronic atrial fibrillation, unspecified: Secondary | ICD-10-CM

## 2022-11-12 DIAGNOSIS — I2510 Atherosclerotic heart disease of native coronary artery without angina pectoris: Secondary | ICD-10-CM

## 2022-11-12 DIAGNOSIS — I255 Ischemic cardiomyopathy: Secondary | ICD-10-CM

## 2022-11-12 DIAGNOSIS — Z79899 Other long term (current) drug therapy: Secondary | ICD-10-CM

## 2022-11-14 NOTE — Telephone Encounter (Signed)
Prescription refill request for Xarelto received.  Indication:afib Last office visit:10/23 Weight:98  kg Age:57 Scr:0.7 CrCl:165.28  ml/min  Prescription refilled

## 2023-02-12 ENCOUNTER — Other Ambulatory Visit: Payer: Self-pay | Admitting: Cardiovascular Disease

## 2023-04-10 ENCOUNTER — Other Ambulatory Visit: Payer: Self-pay | Admitting: Internal Medicine

## 2023-06-09 ENCOUNTER — Other Ambulatory Visit: Payer: Self-pay | Admitting: Cardiovascular Disease

## 2023-06-09 DIAGNOSIS — I10 Essential (primary) hypertension: Secondary | ICD-10-CM

## 2023-06-09 DIAGNOSIS — I2510 Atherosclerotic heart disease of native coronary artery without angina pectoris: Secondary | ICD-10-CM

## 2023-06-09 DIAGNOSIS — I4891 Unspecified atrial fibrillation: Secondary | ICD-10-CM

## 2023-06-09 DIAGNOSIS — E785 Hyperlipidemia, unspecified: Secondary | ICD-10-CM

## 2023-06-09 DIAGNOSIS — I255 Ischemic cardiomyopathy: Secondary | ICD-10-CM

## 2023-06-16 ENCOUNTER — Other Ambulatory Visit: Payer: Self-pay

## 2023-06-16 MED ORDER — ATORVASTATIN CALCIUM 40 MG PO TABS: 40.0000 mg | ORAL_TABLET | Freq: Every day | ORAL | 0 refills | Status: DC

## 2023-06-23 ENCOUNTER — Other Ambulatory Visit: Payer: Self-pay | Admitting: Cardiovascular Disease

## 2023-06-23 DIAGNOSIS — I482 Chronic atrial fibrillation, unspecified: Secondary | ICD-10-CM

## 2023-06-23 DIAGNOSIS — I2510 Atherosclerotic heart disease of native coronary artery without angina pectoris: Secondary | ICD-10-CM

## 2023-06-23 DIAGNOSIS — I255 Ischemic cardiomyopathy: Secondary | ICD-10-CM

## 2023-06-23 DIAGNOSIS — Z79899 Other long term (current) drug therapy: Secondary | ICD-10-CM

## 2023-06-23 NOTE — Telephone Encounter (Signed)
Prescription refill request for Xarelto received.  Indication: Afib  Last office visit:04/25/22 Ladona Ridgel)  Weight: 98kg Age: 57 Scr: 0.78 (02/02/22)  CrCl: 146.11ml/min  Per office visit on 04/25/22 "Your next appointment:  As needed".   Will refill medication.

## 2023-08-26 ENCOUNTER — Other Ambulatory Visit: Payer: Self-pay | Admitting: Cardiovascular Disease

## 2023-09-13 ENCOUNTER — Other Ambulatory Visit: Payer: Self-pay | Admitting: Cardiovascular Disease

## 2023-09-13 ENCOUNTER — Other Ambulatory Visit: Payer: Self-pay | Admitting: Internal Medicine

## 2023-10-06 ENCOUNTER — Other Ambulatory Visit: Payer: Self-pay | Admitting: Cardiovascular Disease

## 2023-10-06 DIAGNOSIS — I4891 Unspecified atrial fibrillation: Secondary | ICD-10-CM

## 2023-10-06 DIAGNOSIS — I10 Essential (primary) hypertension: Secondary | ICD-10-CM

## 2023-10-06 DIAGNOSIS — I482 Chronic atrial fibrillation, unspecified: Secondary | ICD-10-CM

## 2023-10-06 DIAGNOSIS — E785 Hyperlipidemia, unspecified: Secondary | ICD-10-CM

## 2023-10-06 DIAGNOSIS — I255 Ischemic cardiomyopathy: Secondary | ICD-10-CM

## 2023-10-06 DIAGNOSIS — I2510 Atherosclerotic heart disease of native coronary artery without angina pectoris: Secondary | ICD-10-CM

## 2023-10-06 MED ORDER — RIVAROXABAN 20 MG PO TABS: 20.0000 mg | ORAL_TABLET | Freq: Every day | ORAL | 5 refills | Status: DC

## 2023-10-06 MED ORDER — ATORVASTATIN CALCIUM 40 MG PO TABS: 40.0000 mg | ORAL_TABLET | Freq: Every day | ORAL | 0 refills | Status: DC

## 2023-10-06 MED ORDER — CARVEDILOL 25 MG PO TABS: 25.0000 mg | ORAL_TABLET | Freq: Two times a day (BID) | ORAL | 0 refills | Status: DC

## 2023-10-06 MED ORDER — DAPAGLIFLOZIN PROPANEDIOL 10 MG PO TABS: 10.0000 mg | ORAL_TABLET | Freq: Every day | ORAL | 0 refills | Status: DC

## 2023-10-06 MED ORDER — ENTRESTO 49-51 MG PO TABS: 1.0000 | ORAL_TABLET | Freq: Two times a day (BID) | ORAL | 0 refills | Status: AC

## 2023-10-06 NOTE — Telephone Encounter (Signed)
 Prescription refill request for Xarelto received.  Indication:afib Last office visit:upcoming Weight:98  kg Age:58 GNF:AOZHY labs CrCl:needs labs  Prescription refilled

## 2023-10-06 NOTE — Telephone Encounter (Signed)
*  STAT* If patient is at the pharmacy, call can be transferred to refill team.   1. Which medications need to be refilled? (please list name of each medication and dose if known) ENTRESTO 49-51 MG  dapagliflozin propanediol (FARXIGA) 10 MG TABS tablet  rivaroxaban (XARELTO) 20 MG TABS tablet  atorvastatin (LIPITOR) 40 MG tablet  carvedilol (COREG) 25 MG tablet  2. Which pharmacy/location (including street and city if local pharmacy) is medication to be sent to? [id number] - [place], [place] - [address] [phone]   3. Do they need a 30 day or 90 day supply? 90

## 2023-10-06 NOTE — Telephone Encounter (Signed)
 Pt's medications were sent to pt's pharmacy as requested. Enough medications to get pt to his appointment with Dr. Allyson Sabal. Confirmation received.

## 2023-10-31 ENCOUNTER — Ambulatory Visit: Attending: Cardiovascular Disease | Admitting: Cardiovascular Disease

## 2023-11-02 ENCOUNTER — Encounter: Payer: Self-pay | Admitting: Cardiovascular Disease

## 2023-11-06 ENCOUNTER — Encounter: Payer: Self-pay | Admitting: Cardiovascular Disease

## 2023-11-06 ENCOUNTER — Ambulatory Visit: Attending: Cardiovascular Disease | Admitting: Cardiovascular Disease

## 2023-11-06 VITALS — BP 132/84 | HR 62 | Ht 69.0 in

## 2023-11-06 DIAGNOSIS — I10 Essential (primary) hypertension: Secondary | ICD-10-CM

## 2023-11-06 DIAGNOSIS — I255 Ischemic cardiomyopathy: Secondary | ICD-10-CM

## 2023-11-06 DIAGNOSIS — I4891 Unspecified atrial fibrillation: Secondary | ICD-10-CM | POA: Diagnosis not present

## 2023-11-06 DIAGNOSIS — E782 Mixed hyperlipidemia: Secondary | ICD-10-CM | POA: Diagnosis not present

## 2023-11-06 LAB — CBC

## 2023-11-06 NOTE — Assessment & Plan Note (Signed)
 History of hyperlipidemia on statin therapy with lipid profile performed 08/23/2022 revealing a total cholesterol 120, LDL 64 and HDL 27.

## 2023-11-06 NOTE — Assessment & Plan Note (Addendum)
 History of ischemic cardiomyopathy with an EF in the 30 to 35% range by 2D echo 09/06/2021.  His echo the year before was 20 to 25%.  He is on GDMT.  He is very active and asymptomatic.  He did have a Myoview  stress test performed 02/15/2021 that showed scar without ischemia from his remote heart attack in 1999.  We had a long talk about the benefits of ICD therapy for primary prevention of sudden cardiac death.  He has seen Dr. Carolynne Citron in the past on 04/25/2022 who offered him the option although currently he does not wish to pursue this.

## 2023-11-06 NOTE — Assessment & Plan Note (Signed)
 History of essential hypertension with blood pressure measured today at 132/84.  He is on carvedilol  and Entresto .

## 2023-11-06 NOTE — Progress Notes (Signed)
 This    11/06/2023 Garner Jury   07-09-1965  [id number]  [facility], [facility] Primary Cardiologist: Avanell Leigh MD Lathan Poke, [place], [place]  HPI:  William [name] a 58 y.o.   married [place], father of 3 stepchildren, who I saw 04/05/2019.  He is accompanied by his wife Treva Fuchs today.  He has a history of CAD status post anterior wall myocardial infarction back in 1999 with PCI and stenting at [facility]. His other problems include discontinued tobacco abuse, hypertension, and hyperlipidemia. 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 His last lipid profile performed 04/22/13 revealed a total cholesterol of 145, LDL 75 HDL of 37. A Myoview  stress test performed in our office 04/04/13 revealed scar in the LAD territory without ischemia. Since I saw him in the office one year ago he's remained currently stable denying chest pain or shortness of breath. When I saw him one year ago he was in atrial fibrillation with a controlled ventricular response but was asymptomatic. I did begin him on Xarelto .  His most recent 2D echo performed 01/07/2019 shows persistently reduced EF of 30 to 35% which is not changed.  A pharmacologic Myoview  stress test performed 03/29/2019 showed scar in the LAD territory  unchanged from prior  Myoview .     Since I saw him in the office 5 years ago he continues to do well.  He did see Lawana Pray, FNP 2 years ago.  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  He denies chest pain or shortness of breath.  His wife does say that he has symptoms of obstructive sleep apnea but is he does not wish to pursue a sleep study.  He did see Dr. Jalene Mayor for consideration of ICD implantation for primary  prevention but declined this as well.  Current Meds  Medication Sig   acetaminophen  (TYLENOL ) 325 MG tablet Take 2 tablets (650 mg total) by mouth every 6 (six) hours as needed for mild pain (or Fever >/= 101).   atorvastatin  (LIPITOR) 40 MG tablet Take 1 tablet (40 mg total) by mouth at bedtime.   carvedilol  (COREG ) 25 MG tablet Take 1 tablet (25 mg total) by mouth 2 (two) times daily.   dapagliflozin  propanediol (FARXIGA ) 10 MG TABS tablet Take 1 tablet (10 mg total) by mouth daily.   ENTRESTO  49-51 MG Take 1 tablet by mouth 2 (two) times daily.   fish oil-omega-3 fatty acids 1000 MG capsule Take 1 g by mouth 2 (two) times daily.   glyBURIDE -metformin  (GLUCOVANCE ) 2.5-500 MG tablet Take half tablet by mouth twice a day for 2 weeks; then increase to full tablet by mouth twice a day.   Multiple Vitamin (MULTIVITAMIN WITH MINERALS) TABS tablet Take 1 tablet by mouth daily.   niacin  500 MG tablet Take 500 mg by mouth daily with breakfast.   rivaroxaban  (XARELTO ) 20 MG TABS tablet Take 1 tablet (20 mg total) by mouth daily with supper.     Allergies  Allergen Reactions   Aspirin      Cannot take due to being on blood thinner-per pt    Social History   Socioeconomic History   Marital status: Married    Spouse name: Not on  file   Number of children: Not on file   Years of education: Not on file   Highest education level: Not on file  Occupational History   Not on file  Tobacco Use   Smoking status: Never   Smokeless tobacco: Never  Vaping Use   Vaping status: Never Used  Substance and Sexual Activity   Alcohol use: No   Drug use: Never   Sexual activity: Not on file  Other Topics Concern   Not on file  Social History Narrative   Not on file   Social Drivers of Health   Financial Resource Strain: Not on file  Food Insecurity: Not on file  Transportation Needs: Not on file  Physical Activity: Not on file  Stress: Not on file  Social Connections: Not on file  Intimate  Partner Violence: Not on file     Review of Systems: General: negative for chills, fever, night sweats or weight changes.  Cardiovascular: negative for chest pain, dyspnea on exertion, edema, orthopnea, palpitations, paroxysmal nocturnal dyspnea or shortness of breath Dermatological: negative for rash Respiratory: negative for cough or wheezing Urologic: negative for hematuria Abdominal: negative for nausea, vomiting, diarrhea, bright red blood per rectum, melena, or hematemesis Neurologic: negative for visual changes, syncope, or dizziness All other systems reviewed and are otherwise negative except as noted above.    Blood pressure 132/84, pulse 62, height 5\' 9"  (1.753 m), SpO2 99%.  General appearance: alert and no distress Neck: no adenopathy, no carotid bruit, no JVD, supple, symmetrical, trachea midline, and thyroid not enlarged, symmetric, no tenderness/mass/nodules Lungs: clear to auscultation bilaterally Heart: irregularly irregular rhythm Extremities: extremities normal, atraumatic, no cyanosis or edema Pulses: 2+ and symmetric Skin: Skin color, texture, turgor normal. No rashes or lesions Neurologic: Grossly normal  EKG EKG Interpretation Date/Time:  Monday Nov 06 2023 08:24:40 EDT Ventricular Rate:  62 PR Interval:    QRS Duration:  98 QT Interval:  400 QTC Calculation: 406 R Axis:   -64  Text Interpretation: Atrial fibrillation Left axis deviation Inferior infarct , age undetermined Anterolateral infarct , age undetermined When compared with ECG of 08-Nov-2018 21:46, PREVIOUS ECG IS PRESENT Confirmed by Lauro Portal [phone]) on 11/06/2023 8:45:27 AM    ASSESSMENT AND PLAN:   Essential hypertension History of essential hypertension with blood pressure measured today at 132/84.  He is on carvedilol  and Entresto .  Hyperlipidemia History of hyperlipidemia on statin therapy with lipid profile performed 08/23/2022 revealing a total cholesterol 120, LDL 64 and HDL  27.  Cardiomyopathy, ischemic History of ischemic cardiomyopathy with an EF in the 30 to 35% range by 2D echo 09/06/2021.  His echo the year before was 20 to 25%.  He is on GDMT.  He is very active and asymptomatic.  He did have a Myoview  stress test performed 02/15/2021 that showed scar without ischemia from his remote heart attack in 1999.  We had a long talk about the benefits of ICD therapy for primary prevention of sudden cardiac death.  He has seen Dr. Carolynne Citron in the past on 04/25/2022 who offered him the option although currently he does not wish to pursue this.  Atrial fibrillation (HCC) History of persistent A-fib rate controlled on Xarelto  oral anticoagulation.     Avanell Leigh MD FACP,FACC,FAHA, FSCAI 11/06/2023 9:00 AM

## 2023-11-06 NOTE — Assessment & Plan Note (Signed)
History of persistent A. fib rate controlled on Xarelto oral anticoagulation. 

## 2023-11-06 NOTE — Patient Instructions (Signed)
 Medication Instructions:  Your physician recommends that you continue on your current medications as directed. Please refer to the Current Medication list given to you today.  *If you need a refill on your cardiac medications before your next appointment, please call your pharmacy*  Lab Work: Your physician recommends that you have labs drawn today: BMET & CBC  If you have labs (blood work) drawn today and your tests are completely normal, you will receive your results only by: MyChart Message (if you have MyChart) OR A paper copy in the mail If you have any lab test that is abnormal or we need to change your treatment, we will call you to review the results.  Testing/Procedures: Your physician has requested that you have an echocardiogram. Echocardiography is a painless test that uses sound waves to create images of your heart. It provides your doctor with information about the size and shape of your heart and how well your heart's chambers and valves are working. This procedure takes approximately one hour. There are no restrictions for this procedure. Please do NOT wear cologne, perfume, aftershave, or lotions (deodorant is allowed). Please arrive 15 minutes prior to your appointment time. **To do in May 2026**  Please note: We ask at that you not bring children with you during ultrasound (echo/ vascular) testing. Due to room size and safety concerns, children are not allowed in the ultrasound rooms during exams. Our front office staff cannot provide observation of children in our lobby area while testing is being conducted. An adult accompanying a patient to their appointment will only be allowed in the ultrasound room at the discretion of the ultrasound technician under special circumstances. We apologize for any inconvenience.   Follow-Up: At [facility], you and your health needs are our priority.  As part of our continuing mission to provide you with exceptional heart care,  our providers are all part of one team.  This team includes your primary Cardiologist (physician) and Advanced Practice Providers or APPs (Physician Assistants and Nurse Practitioners) who all work together to provide you with the care you need, when you need it.  Your next appointment:   12 month(s)  Provider:   Lawana Pray, NP         Then, Lauro Portal, MD will plan to see you again in 2 year(s).    We recommend signing up for the patient portal called "MyChart".  Sign up information is provided on this After Visit Summary.  MyChart is used to connect with patients for Virtual Visits (Telemedicine).  Patients are able to view lab/test results, encounter notes, upcoming appointments, etc.  Non-urgent messages can be sent to your provider as well.   To learn more about what you can do with MyChart, go to [email or web address].

## 2023-11-07 ENCOUNTER — Telehealth: Payer: Self-pay | Admitting: Cardiovascular Disease

## 2023-11-07 ENCOUNTER — Ambulatory Visit: Payer: Self-pay | Admitting: Cardiovascular Disease

## 2023-11-07 LAB — CBC
Hematocrit: 56.3 % — ABNORMAL HIGH (ref 37.5–51.0)
Hemoglobin: 20 g/dL (ref 13.0–17.7)
MCH: 33.6 pg — ABNORMAL HIGH (ref 26.6–33.0)
MCHC: 35.5 g/dL (ref 31.5–35.7)
MCV: 95 fL (ref 79–97)
Platelets: 179 10*3/uL (ref 150–450)
RBC: 5.96 x10E6/uL — ABNORMAL HIGH (ref 4.14–5.80)
RDW: 13 % (ref 11.6–15.4)
WBC: 6.9 10*3/uL (ref 3.4–10.8)

## 2023-11-07 LAB — BASIC METABOLIC PANEL WITH GFR
BUN/Creatinine Ratio: 11 (ref 9–20)
BUN: 11 mg/dL (ref 6–24)
CO2: 21 mmol/L (ref 20–29)
Calcium: 9.6 mg/dL (ref 8.7–10.2)
Chloride: 104 mmol/L (ref 96–106)
Creatinine, Ser: 0.98 mg/dL (ref 0.76–1.27)
Glucose: 103 mg/dL — ABNORMAL HIGH (ref 70–99)
Potassium: 4.3 mmol/L (ref 3.5–5.2)
Sodium: 141 mmol/L (ref 134–144)
eGFR: 91 mL/min/{1.73_m2} (ref >59)

## 2023-11-07 NOTE — Telephone Encounter (Signed)
 Needs PCP to review.  Aleda Ammon ,MD

## 2023-11-07 NOTE — Telephone Encounter (Signed)
 Received a message from [facility]  -  CBCcompleted hgb register  20.0. on 11/08/23   Information sent to Dr Katheryne Pane to review.

## 2023-11-07 NOTE — Telephone Encounter (Signed)
 Caller [facility]) is reporting out of range results.

## 2023-11-07 NOTE — Telephone Encounter (Signed)
 Called and no answer. Routed lab result to [facility]

## 2023-12-05 ENCOUNTER — Other Ambulatory Visit: Payer: Self-pay | Admitting: Cardiovascular Disease

## 2023-12-09 ENCOUNTER — Other Ambulatory Visit: Payer: Self-pay | Admitting: Cardiovascular Disease

## 2023-12-18 ENCOUNTER — Other Ambulatory Visit: Payer: Self-pay | Admitting: Cardiovascular Disease

## 2024-01-29 ENCOUNTER — Encounter: Payer: Self-pay | Admitting: Orthopedic Surgery

## 2024-01-29 ENCOUNTER — Other Ambulatory Visit (INDEPENDENT_AMBULATORY_CARE_PROVIDER_SITE_OTHER): Payer: Self-pay

## 2024-01-29 ENCOUNTER — Ambulatory Visit: Admitting: Orthopedic Surgery

## 2024-01-29 DIAGNOSIS — M25571 Pain in right ankle and joints of right foot: Secondary | ICD-10-CM

## 2024-01-29 DIAGNOSIS — M9261 Juvenile osteochondrosis of tarsus, right ankle: Secondary | ICD-10-CM | POA: Diagnosis not present

## 2024-01-29 NOTE — Progress Notes (Signed)
 Office Visit Note   Patient: William Hill           Date of Birth: 1965-09-25           MRN: [id number] Visit Date: 01/29/2024              Requested by: Marvine Rush, MD [address] [place],  [place] [address] PCP: Marvine Rush, MD  Chief Complaint  Patient presents with   Right Ankle - Pain      HPI: Patient is a 58 year old gentleman who presents for initial evaluation for painful Haglund's deformity right foot.  Patient states his blood pressure is within normal limits and A1c is 5.1.  Patient states he has had swelling denies a history of gout.  Patient states he works on his feet 10 to 12 hours a day and symptoms are worse with work.  Assessment & Plan: Visit Diagnoses:  1. Pain in right ankle and joints of right foot   2. Haglund deformity of right heel     Plan: Patient was provided a note to say that he is out of work for 4 weeks.  Will draw uric acid and follow-up in 4 weeks.  Discussed treatment options of conservative treatment versus surgical intervention and the risks associated with surgery including nonhealing of the wound and Achilles tendon rupture.  Follow-Up Instructions: Return in about 4 weeks (around 02/26/2024).   Ortho Exam  Patient is alert, oriented, no adenopathy, well-dressed, normal affect, normal respiratory effort. Examination patient has a palpable dorsalis pedis pulse he has dorsiflexion 10 degrees past neutral.  He has a large Haglund's deformity that is tender to palpation.    Imaging: XR Ankle Complete Right Result Date: 01/29/2024 Radiograph shows a large Haglund's deformity with a large area of calcification of the insertion of the Achilles.  No images are attached to the encounter.  Labs: Lab Results  Component Value Date   HGBA1C 8.6 (H) 11/11/2018   REPTSTATUS 11/15/2018 FINAL 11/10/2018   REPTSTATUS 11/15/2018 FINAL 11/10/2018   CULT  11/10/2018    NO GROWTH 5 DAYS Performed at [facility], [address]  [place], [place] [address]    CULT  11/10/2018    NO GROWTH 5 DAYS Performed at [facility], [address] [place], [place] [address]    LABORGA GROUP A STREP (S.PYOGENES) ISOLATED 11/09/2018     Lab Results  Component Value Date   ALBUMIN 2.6 (L) 11/10/2018   ALBUMIN 2.9 (L) 11/09/2018   ALBUMIN 3.2 (L) 11/09/2018    No results found for: MG No results found for: VD25OH  No results found for: PREALBUMIN    Latest Ref Rng & Units 11/06/2023    9:33 AM 02/02/2022    8:43 AM 11/10/2018    5:57 AM  CBC EXTENDED  WBC 3.4 - 10.8 x10E3/uL 6.9  10.0  10.9   RBC 4.14 - 5.80 x10E6/uL 5.96  4.95  3.52   Hemoglobin 13.0 - 17.7 g/dL 79.9  82.9  87.7   HCT 37.5 - 51.0 % 56.3  48.7  33.3   Platelets 150 - 450 x10E3/uL 179  152  93   NEUT# 1.7 - 7.7 K/uL  6.2  9.8   Lymph# 0.7 - 4.0 K/uL  2.2  0.5      There is no height or weight on file to calculate BMI.  Orders:  Orders Placed This Encounter  Procedures   XR Ankle Complete Right   Uric acid   No  orders of the defined types were placed in this encounter.    Procedures: No procedures performed  Clinical Data: No additional findings.  ROS:  All other systems negative, except as noted in the HPI. Review of Systems  Objective: Vital Signs: There were no vitals taken for this visit.  Specialty Comments:  No specialty comments available.  PMFS History: Patient Active Problem List   Diagnosis Date Noted   Type 2 diabetes mellitus with hyperglycemia, without long-term current use of insulin (HCC)    Septic shock due to undetermined organism (HCC) 11/09/2018   CAD (coronary artery disease) 11/09/2018   AKI (acute kidney injury) (HCC) 11/09/2018   Acute gastroenteritis 11/09/2018   Hyperglycemia 11/09/2018   Lactic acidosis 11/09/2018   Sepsis with acute renal failure (HCC)    Chronic systolic CHF (congestive heart failure) (HCC)    Cat scratch fever 08/31/2018   Atrial fibrillation (HCC) 01/19/2016   Essential  hypertension 02/11/2013   Hyperlipidemia 02/11/2013   Cardiomyopathy, ischemic 02/11/2013   Past Medical History:  Diagnosis Date   CAD (coronary artery disease)    post anterior wall myocardial infarction in 1999 with PCI and stenting at [facility].   Heart attack [facility])    History of stress test 12/2010   EF of 36% with apical scar   Hx of echocardiogram 02/13/2012   EF 35%. There is apical dyskinesis   Hx of echocardiogram 05/09/2012   difinity contrast that showed no apical mural thrombus with an EF of 30%-35%   Hyperlipidemia    Hypertension     Family History  Problem Relation Age of Onset   Cancer Father    Heart Problems Maternal Grandmother    Stroke Maternal Grandfather    Heart attack Paternal Grandfather     Past Surgical History:  Procedure Laterality Date   BACK SURGERY     CORONARY ANGIOPLASTY     Social History   Occupational History   Not on file  Tobacco Use   Smoking status: Never   Smokeless tobacco: Never  Vaping Use   Vaping status: Never Used  Substance and Sexual Activity   Alcohol use: No   Drug use: Never   Sexual activity: Not on file

## 2024-01-30 ENCOUNTER — Telehealth: Payer: Self-pay | Admitting: Orthopedic Surgery

## 2024-01-30 LAB — URIC ACID: Uric Acid, Serum: 6.4 mg/dL (ref 4.0–8.0)

## 2024-01-30 NOTE — Telephone Encounter (Signed)
 Pt called stating his was suppose to get results of blood work. Please call pt at [phone].

## 2024-02-13 ENCOUNTER — Telehealth: Payer: Self-pay | Admitting: Orthopedic Surgery

## 2024-02-13 NOTE — Telephone Encounter (Signed)
 IC patient 8/14 and again today. I tried both numbers. Cell just rang and said try call again later, home ph someone answers but static, unknown if they could hear me, they hung up, this was on 8/14 and this am. Also sent a mychart msg on 8/14. Patient signed shara and pd form fee on 8/12,however, I have not received a form. Unable to reach patient. Will hold.

## 2024-02-16 ENCOUNTER — Telehealth: Payer: Self-pay | Admitting: Orthopedic Surgery

## 2024-02-16 ENCOUNTER — Telehealth: Payer: Self-pay

## 2024-02-16 NOTE — Telephone Encounter (Signed)
 Patient called stating that Dr. Harden wrote him a out of work note for 4 weeks on a prescription and it had no dates listed and his company is asking for a letter with dates. He was here on 01/29/24 when Dr. Harden took him out of work for 4 weeks and his return appointment is 02/27/24.  He is asking for a letter to be wrote and a return call to him with the status of the letter.  Please call and advise [phone]

## 2024-02-16 NOTE — Telephone Encounter (Signed)
 Received vm from patient stating that he has contacted [employer] and forms are to be faxed to our office. Patient signed shara on 8/12, however we have not received forms. I tried contacting patient on cell and home ph 8/13 & 8/14(documented) to advise that we have not received forms but unable to reach pt. Now, he is almost at his deadline to have these submitted for a 4 week absence. Will anticipate receiving forms.

## 2024-02-16 NOTE — Telephone Encounter (Signed)
 Letter written in chart. Calling pt to inform him can pick up today if needed.

## 2024-02-16 NOTE — Telephone Encounter (Signed)
 Called both of pt's number's listed in chart. They only ring, no VM ever picked up. Reached pt's wife and let her know.

## 2024-02-19 NOTE — Telephone Encounter (Signed)
 IC patient on cell and hm phone, still unable to reach patient. IC pts spouse and left vm advising that we still have not received anything from pts insurance company.

## 2024-02-20 NOTE — Telephone Encounter (Signed)
 [employer] forms received. Completed and faxed back.

## 2024-02-27 ENCOUNTER — Ambulatory Visit (INDEPENDENT_AMBULATORY_CARE_PROVIDER_SITE_OTHER): Admitting: Orthopedic Surgery

## 2024-02-27 ENCOUNTER — Telehealth: Payer: Self-pay | Admitting: Orthopedic Surgery

## 2024-02-27 DIAGNOSIS — M9261 Juvenile osteochondrosis of tarsus, right ankle: Secondary | ICD-10-CM | POA: Diagnosis not present

## 2024-02-27 DIAGNOSIS — M25571 Pain in right ankle and joints of right foot: Secondary | ICD-10-CM | POA: Diagnosis not present

## 2024-02-27 NOTE — Telephone Encounter (Signed)
 Patient came in and dropped off his FMLA forms to be processed.  Upon completion the pt would like for them t be faxed to [employer] [phone] and stated that they are needing them back by March 01, 2024 he is aware that we ask for 7-10 business days to complete any forms. Patient paid $20.00 in cash.  Forms placed in tray for Tammy

## 2024-02-28 ENCOUNTER — Encounter: Payer: Self-pay | Admitting: Orthopedic Surgery

## 2024-02-28 NOTE — Progress Notes (Signed)
 Office Visit Note   Patient: William Hill           Date of Birth: Jul 23, 1965           MRN: [id number] Visit Date: 02/27/2024              Requested by: Marvine Rush, MD [address] [place],  [place] [address] PCP: Marvine Rush, MD  Chief Complaint  Patient presents with   Right Ankle - Follow-up      HPI: Discussed the use of AI scribe software for clinical note transcription with the patient, who gave verbal consent to proceed.  History of Present Illness William [name] a 58 year old male with Haglund's deformity and Achilles tendinitis who presents with heel pain.  He experiences persistent pain at the back of his heel, particularly when putting on shoes and taking steps. The pain significantly impacts his ability to work, where he is required to walk up to 7.3 miles per shift. He has been using Voltaren gel as prescribed, which has reduced the swelling but not alleviated the pain completely.  He has a history of smoking but is currently not smoking. He is concerned about the impact of his job on his condition, as he experiences increased pain after a day of work. He has been performing exercises to strengthen his Achilles tendon.  He is currently not working.     Assessment & Plan: Visit Diagnoses:  1. Pain in right ankle and joints of right foot   2. Haglund deformity of right heel     Plan: Assessment and Plan Assessment & Plan Right heel Haglund's deformity with Achilles tendinitis Chronic Haglund's deformity with Achilles tendinitis. Pain with shoes and walking. Swelling reduced with Voltaren gel. Calcification at Achilles insertion. Surgical risks significant; conservative management preferred. - Continue Voltaren gel for pain and swelling. - Perform strengthening exercises: toe raises on one foot for 30 seconds, three times daily, increase weight as tolerated. - Avoid surgery unless symptoms become intolerable. - Provide work note for four weeks  off for rehabilitation.  Follow-Up - Instruct to call office if further assistance or evaluation needed.      Follow-Up Instructions: Return if symptoms worsen or fail to improve.   Ortho Exam  Patient is alert, oriented, no adenopathy, well-dressed, normal affect, normal respiratory effort. Physical Exam CARDIOVASCULAR: Posterior tibial pulse intact. MUSCULOSKELETAL: Haglund's deformity with reduced swelling. Tenderness to palpation at Achilles insertion. Plantar fascia non-tender to palpation. Good dorsiflexion of the ankle.      Imaging: No results found. No images are attached to the encounter.  Labs: Lab Results  Component Value Date   HGBA1C 8.6 (H) 11/11/2018   LABURIC 6.4 01/29/2024   REPTSTATUS 11/15/2018 FINAL 11/10/2018   REPTSTATUS 11/15/2018 FINAL 11/10/2018   CULT  11/10/2018    NO GROWTH 5 DAYS Performed at [facility], [address], [place], [place] [address]    CULT  11/10/2018    NO GROWTH 5 DAYS Performed at [facility], [address] [place], [place] [address]    LABORGA GROUP A STREP (S.PYOGENES) ISOLATED 11/09/2018     Lab Results  Component Value Date   ALBUMIN 2.6 (L) 11/10/2018   ALBUMIN 2.9 (L) 11/09/2018   ALBUMIN 3.2 (L) 11/09/2018    No results found for: MG No results found for: VD25OH  No results found for: PREALBUMIN    Latest Ref Rng & Units 11/06/2023    9:33 AM 02/02/2022  8:43 AM 11/10/2018    5:57 AM  CBC EXTENDED  WBC 3.4 - 10.8 x10E3/uL 6.9  10.0  10.9   RBC 4.14 - 5.80 x10E6/uL 5.96  4.95  3.52   Hemoglobin 13.0 - 17.7 g/dL 79.9  82.9  87.7   HCT 37.5 - 51.0 % 56.3  48.7  33.3   Platelets 150 - 450 x10E3/uL 179  152  93   NEUT# 1.7 - 7.7 K/uL  6.2  9.8   Lymph# 0.7 - 4.0 K/uL  2.2  0.5      There is no height or weight on file to calculate BMI.  Orders:  No orders of the defined types were placed in this encounter.  No orders of the defined types were placed in this encounter.     Procedures: No procedures performed  Clinical Data: No additional findings.  ROS:  All other systems negative, except as noted in the HPI. Review of Systems  Objective: Vital Signs: There were no vitals taken for this visit.  Specialty Comments:  No specialty comments available.  PMFS History: Patient Active Problem List   Diagnosis Date Noted   Type 2 diabetes mellitus with hyperglycemia, without long-term current use of insulin (HCC)    Septic shock due to undetermined organism (HCC) 11/09/2018   CAD (coronary artery disease) 11/09/2018   AKI (acute kidney injury) (HCC) 11/09/2018   Acute gastroenteritis 11/09/2018   Hyperglycemia 11/09/2018   Lactic acidosis 11/09/2018   Sepsis with acute renal failure (HCC)    Chronic systolic CHF (congestive heart failure) (HCC)    Cat scratch fever 08/31/2018   Atrial fibrillation (HCC) 01/19/2016   Essential hypertension 02/11/2013   Hyperlipidemia 02/11/2013   Cardiomyopathy, ischemic 02/11/2013   Past Medical History:  Diagnosis Date   CAD (coronary artery disease)    post anterior wall myocardial infarction in 1999 with PCI and stenting at [facility].   Heart attack [facility])    History of stress test 12/2010   EF of 36% with apical scar   Hx of echocardiogram 02/13/2012   EF 35%. There is apical dyskinesis   Hx of echocardiogram 05/09/2012   difinity contrast that showed no apical mural thrombus with an EF of 30%-35%   Hyperlipidemia    Hypertension     Family History  Problem Relation Age of Onset   Cancer Father    Heart Problems Maternal Grandmother    Stroke Maternal Grandfather    Heart attack Paternal Grandfather     Past Surgical History:  Procedure Laterality Date   BACK SURGERY     CORONARY ANGIOPLASTY     Social History   Occupational History   Not on file  Tobacco Use   Smoking status: Never   Smokeless tobacco: Never  Vaping Use   Vaping status: Never Used  Substance and Sexual Activity    Alcohol use: No   Drug use: Never   Sexual activity: Not on file

## 2024-02-29 NOTE — Telephone Encounter (Signed)
 Completed and faxed.

## 2024-04-29 ENCOUNTER — Encounter: Payer: Self-pay | Admitting: Radiology

## 2024-06-05 ENCOUNTER — Telehealth: Payer: Self-pay | Admitting: Orthopedic Surgery

## 2024-06-05 NOTE — Telephone Encounter (Signed)
 Received FMLA forms. Please advise. Last seen 02/27/24.Thank you!

## 2024-06-06 NOTE — Telephone Encounter (Signed)
 It looks like Dr. Harden had written a note for work for the pt to do 4 weeks of therapy. If he is having problems he would need eval in office.

## 2024-06-06 NOTE — Telephone Encounter (Signed)
 Tried calling patient to advise he will need to be seen before anymore forms can be completed. Na,voicemail full.

## 2024-06-10 ENCOUNTER — Other Ambulatory Visit: Payer: Self-pay | Admitting: Cardiovascular Disease

## 2024-06-10 DIAGNOSIS — I482 Chronic atrial fibrillation, unspecified: Secondary | ICD-10-CM

## 2024-06-10 NOTE — Telephone Encounter (Signed)
 Prescription refill request for Xarelto  received.  Indication:AFIB Last office visit:5/25 Weight:98  kg Age:58 Scr:0.98  5/25 CrCl:115.28  ml/min  Prescription refilled

## 2024-07-25 ENCOUNTER — Telehealth: Payer: Self-pay

## 2024-07-25 NOTE — Telephone Encounter (Signed)
 NOTED

## 2024-07-25 NOTE — Telephone Encounter (Signed)
 Sending as FYI only Form received on this patient.  Unable to reach him no answer, VM full--would need ROV before completion of forms. Looks like Tammy also tried to reach out to him in regards to this on 06/06/24

## 2024-08-01 ENCOUNTER — Telehealth: Payer: Self-pay | Admitting: Orthopedic Surgery

## 2024-08-01 NOTE — Telephone Encounter (Signed)
 Received call from patient inquiring why we are not completing the [facility] forms. I advised that he needs to schedule an appointment for eval to recheck foot as last seen 02/27/2024. He is requesting intermittent leave only. I advised patient to be sure to discuss this request with Dr. Harden so that it is documented in the ov note and at that point the form can be completed. He expressed understanding.

## 2024-11-04 ENCOUNTER — Other Ambulatory Visit (HOSPITAL_COMMUNITY)
